# Patient Record
Sex: Female | Born: 1995 | Race: Black or African American | Hispanic: No | Marital: Single | Smoking: Current every day smoker
Health system: Southern US, Community
[De-identification: ages and names within clinical notes are randomized; demographics above are authoritative.]

## PROBLEM LIST (undated history)

## (undated) ENCOUNTER — Inpatient Hospital Stay (HOSPITAL_COMMUNITY): Payer: Self-pay

## (undated) DIAGNOSIS — K219 Gastro-esophageal reflux disease without esophagitis: Secondary | ICD-10-CM

## (undated) DIAGNOSIS — Z789 Other specified health status: Secondary | ICD-10-CM

## (undated) DIAGNOSIS — A6000 Herpesviral infection of urogenital system, unspecified: Secondary | ICD-10-CM

## (undated) DIAGNOSIS — J45909 Unspecified asthma, uncomplicated: Secondary | ICD-10-CM

## (undated) DIAGNOSIS — O24419 Gestational diabetes mellitus in pregnancy, unspecified control: Secondary | ICD-10-CM

## (undated) HISTORY — PX: TONSILLECTOMY: SUR1361

## (undated) HISTORY — DX: Gastro-esophageal reflux disease without esophagitis: K21.9

---

## 2020-06-04 ENCOUNTER — Ambulatory Visit (HOSPITAL_COMMUNITY)
Admission: EM | Admit: 2020-06-04 | Discharge: 2020-06-04 | Disposition: A | Discharge status: Home / Self Care | Payer: Medicaid Other | Attending: Family Medicine | Admitting: Family Medicine

## 2020-06-04 ENCOUNTER — Encounter (HOSPITAL_COMMUNITY): Payer: Self-pay | Admitting: *Deleted

## 2020-06-04 ENCOUNTER — Other Ambulatory Visit: Payer: Self-pay

## 2020-06-04 DIAGNOSIS — M26609 Unspecified temporomandibular joint disorder, unspecified side: Secondary | ICD-10-CM

## 2020-06-04 DIAGNOSIS — G44209 Tension-type headache, unspecified, not intractable: Secondary | ICD-10-CM | POA: Diagnosis not present

## 2020-06-04 MED ORDER — IBUPROFEN 800 MG PO TABS: 800.0000 mg | ORAL_TABLET | Freq: Three times a day (TID) | ORAL | 0 refills | Status: DC

## 2020-06-04 MED ORDER — CYCLOBENZAPRINE HCL 5 MG PO TABS: 5.0000 mg | ORAL_TABLET | Freq: Three times a day (TID) | ORAL | 0 refills | Status: DC | PRN

## 2020-06-04 NOTE — ED Triage Notes (Signed)
Pt reports she grinds her teeth at night and now has dental pain . This has been going on for last 6-7 days. Pt reports all her teeth hurt.

## 2020-06-04 NOTE — Discharge Instructions (Signed)
Get a mouthguard at the pharmacy, take the muscle relaxer and ibuprofen as needed, massage area, use warm compresses as needed. Follow up with a dentist if not better

## 2020-06-04 NOTE — ED Provider Notes (Signed)
[facility]    CSN: [id number] Arrival date & time: 06/04/20  1249      History   Chief Complaint Chief Complaint  Patient presents with   Dental Pain    HPI Joanna Buchanan is a 24 y.o. female.   Patient presenting today with about 6 days of diffuse jaw pain and headaches. Notes sxs worst in the mornings when she first wakes up, and she has caught herself numerous times overnight clenching her jaws lately. Denies sinus pressure or drainage, dizziness, vision changes, fever, chills, mouth lesions, sore throat. Has tried tylenol without relief of her pain. Has not seen a dentist for this issue.      History reviewed. No pertinent past medical history.  There are no problems to display for this patient.   History reviewed. No pertinent surgical history.  OB History   No obstetric history on file.      Home Medications    Prior to Admission medications   Medication Sig Start Date End Date Taking? Authorizing Provider  cyclobenzaprine (FLEXERIL) 5 MG tablet Take 1 tablet (5 mg total) by mouth 3 (three) times daily as needed for muscle spasms. 06/04/20   Particia Nearing, PA-C  ibuprofen (ADVIL) 800 MG tablet Take 1 tablet (800 mg total) by mouth 3 (three) times daily. 06/04/20   Particia Nearing, PA-C    Family History History reviewed. No pertinent family history.  Social History Social History   Tobacco Use   Smoking status: Never Smoker   Smokeless tobacco: Never Used  Building services engineer Use: Some days  Substance Use Topics   Alcohol use: Never   Drug use: Yes    Types: Marijuana     Allergies   Other   Review of Systems Review of Systems PER HPI    Physical Exam Triage Vital Signs ED Triage Vitals  Enc Vitals Group     BP 06/04/20 1426 118/87     Pulse Rate 06/04/20 1426 91     Resp 06/04/20 1426 15     Temp 06/04/20 1426 98.2 F (36.8 C)     Temp Source 06/04/20 1426 Oral     SpO2 06/04/20 1426 100 %      Weight 06/04/20 1430 288 lb (130.6 kg)     Height 06/04/20 1430 5\' 9"  (1.753 m)     Head Circumference --      Peak Flow --      Pain Score 06/04/20 1429 10     Pain Loc --      Pain Edu? --      Excl. in GC? --    No data found.  Updated Vital Signs BP 118/87 (BP Location: Right Arm)    Pulse 91    Temp 98.2 F (36.8 C) (Oral)    Resp 15    Ht 5\' 9"  (1.753 m)    Wt 288 lb (130.6 kg)    LMP 05/14/2020    SpO2 100%    BMI 42.53 kg/m   Visual Acuity Right Eye Distance:   Left Eye Distance:   Bilateral Distance:    Right Eye Near:   Left Eye Near:    Bilateral Near:     Physical Exam Vitals and nursing note reviewed.  Constitutional:      Appearance: Normal appearance. She is not ill-appearing.  HENT:     Head: Atraumatic.     Right Ear: Tympanic membrane normal.     Left Ear:  Tympanic membrane normal.     Nose: Nose normal.     Mouth/Throat:     Mouth: Mucous membranes are moist.     Pharynx: Oropharynx is clear. No posterior oropharyngeal erythema.     Comments: Tongue, oral mucosa, and teeth all benign appearing. Good dentition overall. No gingival erythema or edema Eyes:     Extraocular Movements: Extraocular movements intact.     Conjunctiva/sclera: Conjunctivae normal.  Cardiovascular:     Rate and Rhythm: Normal rate and regular rhythm.     Heart sounds: Normal heart sounds.  Pulmonary:     Effort: Pulmonary effort is normal.     Breath sounds: Normal breath sounds.  Abdominal:     General: Bowel sounds are normal. There is no distension.     Palpations: Abdomen is soft.     Tenderness: There is no abdominal tenderness. There is no guarding.  Musculoskeletal:        General: Tenderness (TMJ ttp, no crepitus or dislocation) present. Normal range of motion.     Cervical back: Normal range of motion and neck supple.  Skin:    General: Skin is warm and dry.  Neurological:     Mental Status: She is alert and oriented to person, place, and time.  Psychiatric:         Mood and Affect: Mood normal.        Thought Content: Thought content normal.        Judgment: Judgment normal.      UC Treatments / Results  Labs (all labs ordered are listed, but only abnormal results are displayed) Labs Reviewed - No data to display  EKG   Radiology No results found.  Procedures Procedures (including critical care time)  Medications Ordered in UC Medications - No data to display  Initial Impression / Assessment and Plan / UC Course  I have reviewed the triage vital signs and the nursing notes.  Pertinent labs & imaging results that were available during my care of the patient were reviewed by me and considered in my medical decision making (see chart for details).     Suspect TMJ disorder causing her pain sxs. Discussed mouthguard, flexeril, ibuprofen, massage. Eat soft foods for the next week or so, avoid gum chewing. F/u with dentist if sxs not resolving.    Final Clinical Impressions(s) / UC Diagnoses   Final diagnoses:  TMJ (temporomandibular joint disorder)  Tension headache     Discharge Instructions     Get a mouthguard at the pharmacy, take the muscle relaxer and ibuprofen as needed, massage area, use warm compresses as needed. Follow up with a dentist if not better    ED Prescriptions    Medication Sig Dispense Auth. Provider   ibuprofen (ADVIL) 800 MG tablet Take 1 tablet (800 mg total) by mouth 3 (three) times daily. 21 tablet Particia Nearing, [place]   cyclobenzaprine (FLEXERIL) 5 MG tablet Take 1 tablet (5 mg total) by mouth 3 (three) times daily as needed for muscle spasms. 30 tablet Particia Nearing, [place]     PDMP not reviewed this encounter.   Particia Nearing, [place] 06/04/20 1528

## 2020-07-11 ENCOUNTER — Encounter (HOSPITAL_COMMUNITY): Payer: Self-pay | Admitting: Emergency Medicine

## 2020-07-11 ENCOUNTER — Emergency Department (HOSPITAL_COMMUNITY)
Admission: EM | Admit: 2020-07-11 | Discharge: 2020-07-11 | Disposition: A | Discharge status: Home / Self Care | Payer: Medicaid Other | Attending: Emergency Medicine | Admitting: Emergency Medicine

## 2020-07-11 DIAGNOSIS — R1031 Right lower quadrant pain: Secondary | ICD-10-CM | POA: Diagnosis not present

## 2020-07-11 DIAGNOSIS — R112 Nausea with vomiting, unspecified: Secondary | ICD-10-CM | POA: Diagnosis not present

## 2020-07-11 LAB — URINALYSIS, ROUTINE W REFLEX MICROSCOPIC
Bacteria, UA: NONE SEEN
Bilirubin Urine: NEGATIVE
Glucose, UA: NEGATIVE mg/dL
Hgb urine dipstick: NEGATIVE
Ketones, ur: NEGATIVE mg/dL
Nitrite: NEGATIVE
Protein, ur: NEGATIVE mg/dL
Specific Gravity, Urine: 1.02 (ref 1.005–1.030)
pH: 6 (ref 5.0–8.0)

## 2020-07-11 LAB — I-STAT BETA HCG BLOOD, ED (MC, WL, AP ONLY): I-stat hCG, quantitative: <5 m[IU]/mL (ref <5)

## 2020-07-11 LAB — COMPREHENSIVE METABOLIC PANEL
ALT: 17 U/L (ref 0–44)
AST: 21 U/L (ref 15–41)
Albumin: 3.8 g/dL (ref 3.5–5.0)
Alkaline Phosphatase: 51 U/L (ref 38–126)
Anion gap: 9 (ref 5–15)
BUN: 12 mg/dL (ref 6–20)
CO2: 22 mmol/L (ref 22–32)
Calcium: 8.9 mg/dL (ref 8.9–10.3)
Chloride: 107 mmol/L (ref 98–111)
Creatinine, Ser: 0.84 mg/dL (ref 0.44–1.00)
GFR, Estimated: >60 mL/min (ref >60)
Glucose, Bld: 111 mg/dL — ABNORMAL HIGH (ref 70–99)
Potassium: 4.1 mmol/L (ref 3.5–5.1)
Sodium: 138 mmol/L (ref 135–145)
Total Bilirubin: 0.2 mg/dL — ABNORMAL LOW (ref 0.3–1.2)
Total Protein: 7 g/dL (ref 6.5–8.1)

## 2020-07-11 LAB — CBC
HCT: 40.4 % (ref 36.0–46.0)
Hemoglobin: 12 g/dL (ref 12.0–15.0)
MCH: 26 pg (ref 26.0–34.0)
MCHC: 29.7 g/dL — ABNORMAL LOW (ref 30.0–36.0)
MCV: 87.6 fL (ref 80.0–100.0)
Platelets: 362 10*3/uL (ref 150–400)
RBC: 4.61 MIL/uL (ref 3.87–5.11)
RDW: 15 % (ref 11.5–15.5)
WBC: 7.5 10*3/uL (ref 4.0–10.5)
nRBC: 0 % (ref 0.0–0.2)

## 2020-07-11 LAB — LIPASE, BLOOD: Lipase: 34 U/L (ref 11–51)

## 2020-07-11 MED ORDER — DICYCLOMINE HCL 10 MG/ML IM SOLN
20.0000 mg | Freq: Once | INTRAMUSCULAR | Status: AC
  Administered 2020-07-11: 20 mg via INTRAMUSCULAR
  Filled 2020-07-11: qty 2

## 2020-07-11 MED ORDER — ONDANSETRON 4 MG PO TBDP
4.0000 mg | ORAL_TABLET | Freq: Once | ORAL | Status: AC
  Administered 2020-07-11: 4 mg via ORAL
  Filled 2020-07-11: qty 1

## 2020-07-11 MED ORDER — ONDANSETRON 4 MG PO TBDP: 4.0000 mg | ORAL_TABLET | Freq: Three times a day (TID) | ORAL | 0 refills | Status: DC | PRN

## 2020-07-11 NOTE — ED Provider Notes (Signed)
[facility] EMERGENCY DEPARTMENT Provider Note   CSN: [id number] Arrival date & time: 07/11/20  0846     History Chief Complaint  Patient presents with  . Abdominal Cramping    Joanna Buchanan is a 24 y.o. female with history of cesarean section presents to the ED for evaluation of nausea and vomiting, intermittent for the last 1 week.  States this past week she wakes up vomiting and then improves throughout the day and "forgets about it".  This morning felt worse so she came to the ED for evaluation.  Reports abdominal sharp crampy type pain that comes and goes right before she throws up that is located in the right mid abdomen.  This improves after she is not throwing up.  Pain currently is mild. Denies any fevers.  Denies any hematemesis.  Denies any changes in her bowel movements.  Has noticed more urination but no dysuria, hematuria, urgency.  Denies associated abnormal vaginal discharge, vaginal bleeding.  She is not on birth control.  States she may have eaten something last week that could have made her sick.  No sick contacts.  No fever.  No upper respiratory symptoms.  Patient drinks alcohol socially, every other weekend but none recently.  She smokes marijuana almost every day.  No interventions.   HPI     History reviewed. No pertinent past medical history.  There are no problems to display for this patient.   History reviewed. No pertinent surgical history.   OB History   No obstetric history on file.     No family history on file.  Social History   Tobacco Use  . Smoking status: Never Smoker  . Smokeless tobacco: Never Used  Vaping Use  . Vaping Use: Some days  Substance Use Topics  . Alcohol use: Never  . Drug use: Yes    Types: Marijuana    Home Medications Prior to Admission medications   Medication Sig Start Date End Date Taking? Authorizing Provider  cyclobenzaprine (FLEXERIL) 5 MG tablet Take 1 tablet (5 mg total) by mouth 3  (three) times daily as needed for muscle spasms. 06/04/20   Particia Nearing, PA-C  ibuprofen (ADVIL) 800 MG tablet Take 1 tablet (800 mg total) by mouth 3 (three) times daily. 06/04/20   Particia Nearing, PA-C  ondansetron (ZOFRAN ODT) 4 MG disintegrating tablet Take 1 tablet (4 mg total) by mouth every 8 (eight) hours as needed for nausea or vomiting. 07/11/20   Liberty Handy, PA-C    Allergies    Other  Review of Systems   Review of Systems  Gastrointestinal: Positive for abdominal pain, nausea and vomiting.  All other systems reviewed and are negative.   Physical Exam Updated Vital Signs BP 133/89 (BP Location: Left Arm)   Pulse 95   Temp 98.3 F (36.8 C) (Oral)   Resp 18   Ht 5\' 9"  (1.753 m)   Wt 127 kg   LMP 06/06/2020   SpO2 100%   BMI 41.35 kg/m   Physical Exam Vitals and nursing note reviewed.  Constitutional:      Appearance: She is well-developed.     Comments: Non toxic in NAD  HENT:     Head: Normocephalic and atraumatic.     Nose: Nose normal.  Eyes:     Conjunctiva/sclera: Conjunctivae normal.  Cardiovascular:     Rate and Rhythm: Normal rate and regular rhythm.  Pulmonary:     Effort: Pulmonary effort is normal.  Breath sounds: Normal breath sounds.  Abdominal:     General: Bowel sounds are normal.     Palpations: Abdomen is soft.     Tenderness: There is abdominal tenderness (mild).     Comments: Right mid abdominal tenderness.  Soft. No G/R/R. No suprapubic or CVA tenderness. Negative Murphy's and McBurney's. Active BS to lower quadrants.  Cesarean section surgical scar noted, well healed.   Musculoskeletal:        General: Normal range of motion.     Cervical back: Normal range of motion.  Skin:    General: Skin is warm and dry.     Capillary Refill: Capillary refill takes less than 2 seconds.  Neurological:     Mental Status: She is alert.  Psychiatric:        Behavior: Behavior normal.     ED Results / Procedures /  Treatments   Labs (all labs ordered are listed, but only abnormal results are displayed) Labs Reviewed  COMPREHENSIVE METABOLIC PANEL - Abnormal; Notable for the following components:      Result Value   Glucose, Bld 111 (*)    Total Bilirubin 0.2 (*)    All other components within normal limits  CBC - Abnormal; Notable for the following components:   MCHC 29.7 (*)    All other components within normal limits  URINALYSIS, ROUTINE W REFLEX MICROSCOPIC - Abnormal; Notable for the following components:   Leukocytes,Ua MODERATE (*)    All other components within normal limits  LIPASE, BLOOD  I-STAT BETA HCG BLOOD, ED ([facility], [facility], [facility] ONLY)    EKG None  Radiology No results found.  Procedures Procedures (including critical care time)  Medications Ordered in ED Medications  ondansetron (ZOFRAN-ODT) disintegrating tablet 4 mg (4 mg Oral Given 07/11/20 1010)  dicyclomine (BENTYL) injection 20 mg (20 mg Intramuscular Given 07/11/20 1010)    ED Course  I have reviewed the triage vital signs and the nursing notes.  Pertinent labs & imaging results that were available during my care of the patient were reviewed by me and considered in my medical decision making (see chart for details).  Clinical Course as of Jul 11 1040  [facility] Jul 11, 2020  0945 Glori Luis): MODERATE [CG]  0946 Bacteria, UA: NONE SEEN [CG]  0946 I-stat hCG, quantitative: <5.0 [CG]  0946 WBC: 7.5 [CG]  1038 Reevaluated patient.  Reports improvement in symptoms.  No longer vomiting.  Tolerating fluids.  Repeat abdominal exam without abdominal tenderness.   [CG]    Clinical Course User Index [CG] Liberty Handy, PA-C   MDM Rules/Calculators/A&P                          24 year old female here with nausea, vomiting and intermittent right mid abdominal cramping.  Thinks she may have eaten something that made her sick last week.  Smokes marijuana daily.  EMR, triage nurse notes reviewed to tumor history  ER  work-up including lab work, urinalysis, hCG initiated by triage RN.  I have personally visualized and interpreted lab work/ER work up today and this is reassuring.  No leukocytosis.  Normal electrolytes.  Normal LFTs, lipase, creatinine.  hCG is negative.  Urinalysis without convincing signs of infection.  I ordered ODT Zofran, Bentyl IM.  1039: Patient reevaluated after medicines and feels better.  Tolerating p.o.  Repeat abdominal exam benign.  DDx includes viral illness, cannabinoid hyperemesis syndrome versus other.  Considered acute cholecystitis, appendicitis, diverticulitis but  these are unlikely given her benign exam, resolution of symptoms, lab work.  No hematuria, history of kidney stones and this is unlikely.  Patient seen last year for abdominal pain and had a CT scan at that time that was benign did not show any gallstones, kidney stones.  We will discharge patient with symptomatic management, oral hydration, gentle diet.  Return precautions discussed with patient.  She is in agreement with plan of care.   Final Clinical Impression(s) / ED Diagnoses Final diagnoses:  Nausea and vomiting in adult    Rx / DC Orders ED Discharge Orders         Ordered    ondansetron (ZOFRAN ODT) 4 MG disintegrating tablet  Every 8 hours PRN        07/11/20 1041           Liberty Handy, [place] 07/11/20 1041    Wynetta Fines, MD 07/15/20 [phone]

## 2020-07-11 NOTE — Discharge Instructions (Addendum)
You were seen in the ED for nausea, vomiting and abdominal pain  Lab work, urinalysis were all normal  I think your symptoms are from a virus.  We discussed other causes like marijuana, food poisoning  Please take ondansetron every 8 hours for nausea.  Stay hydrated.  Gentle diet until you start to feel better.  Avoid things that will irritate her stomach like greasy, fatty meals, ibuprofen or aspirin products, alcohol, marijuana  Return to the ED for worsening or severe constant abdominal pain, continued nausea and vomiting despite medicines, fevers, urinary symptoms or any other concerns

## 2020-07-11 NOTE — ED Triage Notes (Signed)
Pt arrives to ED with chief complaint of sharp abdominal cramping worse in mid right side with n/v started 3 days ago worse this morning.

## 2020-11-26 ENCOUNTER — Other Ambulatory Visit: Payer: Self-pay

## 2020-11-26 ENCOUNTER — Emergency Department (HOSPITAL_COMMUNITY): Payer: Medicaid Other

## 2020-11-26 ENCOUNTER — Encounter (HOSPITAL_COMMUNITY): Payer: Self-pay

## 2020-11-26 ENCOUNTER — Emergency Department (HOSPITAL_COMMUNITY)
Admission: EM | Admit: 2020-11-26 | Discharge: 2020-11-26 | Disposition: A | Discharge status: Home / Self Care | Payer: Medicaid Other | Attending: Emergency Medicine | Admitting: Emergency Medicine

## 2020-11-26 DIAGNOSIS — R110 Nausea: Secondary | ICD-10-CM | POA: Insufficient documentation

## 2020-11-26 DIAGNOSIS — B9689 Other specified bacterial agents as the cause of diseases classified elsewhere: Secondary | ICD-10-CM | POA: Diagnosis not present

## 2020-11-26 DIAGNOSIS — R030 Elevated blood-pressure reading, without diagnosis of hypertension: Secondary | ICD-10-CM | POA: Diagnosis not present

## 2020-11-26 DIAGNOSIS — K8050 Calculus of bile duct without cholangitis or cholecystitis without obstruction: Secondary | ICD-10-CM

## 2020-11-26 DIAGNOSIS — R1011 Right upper quadrant pain: Secondary | ICD-10-CM | POA: Diagnosis present

## 2020-11-26 DIAGNOSIS — N939 Abnormal uterine and vaginal bleeding, unspecified: Secondary | ICD-10-CM | POA: Diagnosis not present

## 2020-11-26 DIAGNOSIS — D649 Anemia, unspecified: Secondary | ICD-10-CM | POA: Insufficient documentation

## 2020-11-26 DIAGNOSIS — D72829 Elevated white blood cell count, unspecified: Secondary | ICD-10-CM | POA: Diagnosis not present

## 2020-11-26 LAB — COMPREHENSIVE METABOLIC PANEL
ALT: 16 U/L (ref 0–44)
AST: 15 U/L (ref 15–41)
Albumin: 3.7 g/dL (ref 3.5–5.0)
Alkaline Phosphatase: 53 U/L (ref 38–126)
Anion gap: 6 (ref 5–15)
BUN: 11 mg/dL (ref 6–20)
CO2: 21 mmol/L — ABNORMAL LOW (ref 22–32)
Calcium: 9.2 mg/dL (ref 8.9–10.3)
Chloride: 108 mmol/L (ref 98–111)
Creatinine, Ser: 0.79 mg/dL (ref 0.44–1.00)
GFR, Estimated: >60 mL/min (ref >60)
Glucose, Bld: 100 mg/dL — ABNORMAL HIGH (ref 70–99)
Potassium: 3.8 mmol/L (ref 3.5–5.1)
Sodium: 135 mmol/L (ref 135–145)
Total Bilirubin: 0.3 mg/dL (ref 0.3–1.2)
Total Protein: 7.1 g/dL (ref 6.5–8.1)

## 2020-11-26 LAB — CBC
HCT: 37.2 % (ref 36.0–46.0)
Hemoglobin: 11.5 g/dL — ABNORMAL LOW (ref 12.0–15.0)
MCH: 27.4 pg (ref 26.0–34.0)
MCHC: 30.9 g/dL (ref 30.0–36.0)
MCV: 88.8 fL (ref 80.0–100.0)
Platelets: 335 10*3/uL (ref 150–400)
RBC: 4.19 MIL/uL (ref 3.87–5.11)
RDW: 15.8 % — ABNORMAL HIGH (ref 11.5–15.5)
WBC: 6.6 10*3/uL (ref 4.0–10.5)
nRBC: 0 % (ref 0.0–0.2)

## 2020-11-26 LAB — WET PREP, GENITAL
Sperm: NONE SEEN
Trich, Wet Prep: NONE SEEN
Yeast Wet Prep HPF POC: NONE SEEN

## 2020-11-26 LAB — URINALYSIS, ROUTINE W REFLEX MICROSCOPIC
Bilirubin Urine: NEGATIVE
Glucose, UA: NEGATIVE mg/dL
Ketones, ur: NEGATIVE mg/dL
Nitrite: NEGATIVE
Protein, ur: NEGATIVE mg/dL
Specific Gravity, Urine: 1.025 (ref 1.005–1.030)
pH: 6 (ref 5.0–8.0)

## 2020-11-26 LAB — LIPASE, BLOOD: Lipase: 28 U/L (ref 11–51)

## 2020-11-26 LAB — I-STAT BETA HCG BLOOD, ED (MC, WL, AP ONLY): I-stat hCG, quantitative: <5 m[IU]/mL (ref <5)

## 2020-11-26 MED ORDER — METRONIDAZOLE 500 MG PO TABS
500.0000 mg | ORAL_TABLET | Freq: Once | ORAL | Status: AC
  Administered 2020-11-26: 500 mg via ORAL
  Filled 2020-11-26: qty 1

## 2020-11-26 MED ORDER — METRONIDAZOLE 500 MG PO TABS: 500.0000 mg | ORAL_TABLET | Freq: Two times a day (BID) | ORAL | 0 refills | Status: DC

## 2020-11-26 MED ORDER — ONDANSETRON 4 MG PO TBDP
4.0000 mg | ORAL_TABLET | Freq: Once | ORAL | Status: AC
  Administered 2020-11-26: 4 mg via ORAL
  Filled 2020-11-26: qty 1

## 2020-11-26 MED ORDER — OXYCODONE-ACETAMINOPHEN 5-325 MG PO TABS
1.0000 | ORAL_TABLET | Freq: Once | ORAL | Status: AC
  Administered 2020-11-26: 1 via ORAL
  Filled 2020-11-26: qty 1

## 2020-11-26 NOTE — ED Triage Notes (Signed)
Patient complains of a few months of ongoing abdominal pain. Reports some diarrhea with same. No nausea, no vomiting, no dysuria

## 2020-11-26 NOTE — Progress Notes (Signed)
Per Delorise Jackson, RN IV not needed

## 2020-11-26 NOTE — ED Provider Notes (Signed)
[facility]   CSN: [id number] Arrival date & time: 11/26/20  1223     History No chief complaint on file.   Joanna Buchanan is a 25 y.o. female who presents for concern of several months of gradually worsening abdominal pain, worse in the right upper quadrant.  Initially her pain was intermittent, but at this time it waxes and wanes but is constant low dull ache.  Pain is worse after eating, with associated episodes of nausea without vomiting.  Additionally patient endorses greater than 10 bowel movements daily, and small amounts, varying from normally formed stool to watery in nature.  Denies melena or hematochezia.  Recently she completed her LMP approximately 1 week ago but has had persistent intermittent vaginal spotting since that time.  She is sexually active with one female partner in a monogamous relationship, she does not use protection with condoms and she is not on any contraception.  She presents to the emergency department with her toddler daughter.  She denies any fevers or chills at home but does endorse new vaginal discharge which she describes as mucus.  Denies any vaginal pain.  Patient endorses diet primarily consisting of processed foods, large amount of fatty and greasy foods, and very little fruits or vegetables.  She describes her pain as sharp intermittently but constant dull achy pain worse in the right side of her abdomen in the periumbilical area.  Pain not improved with Tylenol or ibuprofen at home.  I personally reviewed this patient's medical records.  She has history of C-section, but carries no medical diagnoses and is not on any medications every day.  HPI     History reviewed. No pertinent past medical history.  There are no problems to display for this patient.   History reviewed. No pertinent surgical history.   OB History   No obstetric history on file.     No family history on  file.  Social History   Tobacco Use  . Smoking status: Never Smoker  . Smokeless tobacco: Never Used  Vaping Use  . Vaping Use: Some days  Substance Use Topics  . Alcohol use: Never  . Drug use: Yes    Types: Marijuana    Home Medications Prior to Admission medications   Medication Sig Start Date End Date Taking? Authorizing Provider  metroNIDAZOLE (FLAGYL) 500 MG tablet Take 1 tablet (500 mg total) by mouth 2 (two) times daily. 11/26/20  Yes Chadwin Fury, Lupe Carneyebekah R, PA-C  cyclobenzaprine (FLEXERIL) 5 MG tablet Take 1 tablet (5 mg total) by mouth 3 (three) times daily as needed for muscle spasms. Patient not taking: No sig reported 06/04/20   Particia NearingLane, Rachel Elizabeth, PA-C  ibuprofen (ADVIL) 800 MG tablet Take 1 tablet (800 mg total) by mouth 3 (three) times daily. Patient not taking: No sig reported 06/04/20   Particia NearingLane, Rachel Elizabeth, PA-C  ondansetron (ZOFRAN ODT) 4 MG disintegrating tablet Take 1 tablet (4 mg total) by mouth every 8 (eight) hours as needed for nausea or vomiting. Patient not taking: No sig reported 07/11/20   Liberty HandyGibbons, Claudia J, PA-C    Allergies    Other, Shellfish allergy, and Shrimp extract allergy skin test  Review of Systems   Review of Systems  Constitutional: Negative for activity change, appetite change, chills, diaphoresis, fatigue and fever.  HENT: Negative.   Respiratory: Negative.   Cardiovascular: Negative.   Gastrointestinal: Positive for abdominal pain, diarrhea and nausea. Negative for blood in stool, constipation and vomiting.  Genitourinary: Positive for frequency, menstrual problem, vaginal bleeding and vaginal discharge. Negative for decreased urine volume, difficulty urinating, dysuria, hematuria, pelvic pain, urgency and vaginal pain.  Musculoskeletal: Negative.   Skin: Negative.   Neurological: Negative.     Physical Exam Updated Vital Signs BP (!) 142/68   Pulse 60   Temp 98.4 F (36.9 C) (Oral)   Resp 20   SpO2 100%   Physical  Exam Vitals and nursing note reviewed. Exam conducted with a chaperone present.  Constitutional:      Appearance: She is obese.  HENT:     Head: Normocephalic and atraumatic.     Nose: Nose normal.     Mouth/Throat:     Mouth: Mucous membranes are moist.     Pharynx: Oropharynx is clear. Uvula midline. No oropharyngeal exudate or posterior oropharyngeal erythema.     Tonsils: No tonsillar exudate or tonsillar abscesses.  Eyes:     General: Lids are normal. Vision grossly intact.        Right eye: No discharge.        Left eye: No discharge.     Extraocular Movements: Extraocular movements intact.     Conjunctiva/sclera: Conjunctivae normal.     Pupils: Pupils are equal, round, and reactive to light.  Neck:     Trachea: Trachea and phonation normal.  Cardiovascular:     Rate and Rhythm: Normal rate and regular rhythm.     Pulses: Normal pulses.     Heart sounds: Normal heart sounds. No murmur heard.   Pulmonary:     Effort: Pulmonary effort is normal. No respiratory distress.     Breath sounds: Normal breath sounds. No wheezing or rales.  Chest:     Chest wall: No mass, lacerations, deformity, tenderness or crepitus.  Abdominal:     General: Bowel sounds are normal. There is no distension.     Palpations: Abdomen is soft.     Tenderness: There is abdominal tenderness in the right upper quadrant, epigastric area, periumbilical area and suprapubic area. There is no right CVA tenderness, left CVA tenderness, guarding or rebound. Positive signs include Murphy's sign. Negative signs include Rovsing's sign and McBurney's sign.  Genitourinary:    Exam position: Lithotomy position.     Vagina: Vaginal discharge and bleeding present.     Cervix: Normal.     Uterus: Normal.      Adnexa: Left adnexa normal.       Right: Tenderness present. No fullness.         Left: No tenderness or fullness.       Comments: Thick clear to yellow discharge in the vaginal vault with scant  bleeding Musculoskeletal:        General: No deformity.     Cervical back: Normal range of motion and neck supple. No tenderness or crepitus. No pain with movement, spinous process tenderness or muscular tenderness.     Right lower leg: No edema.     Left lower leg: No edema.  Lymphadenopathy:     Cervical: No cervical adenopathy.  Skin:    General: Skin is warm and dry.     Capillary Refill: Capillary refill takes less than 2 seconds.  Neurological:     Mental Status: She is alert and oriented to person, place, and time. Mental status is at baseline.     Sensory: Sensation is intact.     Motor: Motor function is intact.     Gait: Gait is intact.  Psychiatric:  Mood and Affect: Mood normal. Affect is tearful.     ED Results / Procedures / Treatments   Labs (all labs ordered are listed, but only abnormal results are displayed) Labs Reviewed  WET PREP, GENITAL - Abnormal; Notable for the following components:      Result Value   Clue Cells Wet Prep HPF POC PRESENT (*)    WBC, Wet Prep HPF POC MANY (*)    All other components within normal limits  COMPREHENSIVE METABOLIC PANEL - Abnormal; Notable for the following components:   CO2 21 (*)    Glucose, Bld 100 (*)    All other components within normal limits  CBC - Abnormal; Notable for the following components:   Hemoglobin 11.5 (*)    RDW 15.8 (*)    All other components within normal limits  URINALYSIS, ROUTINE W REFLEX MICROSCOPIC - Abnormal; Notable for the following components:   APPearance HAZY (*)    Hgb urine dipstick LARGE (*)    Leukocytes,Ua LARGE (*)    Bacteria, UA FEW (*)    All other components within normal limits  LIPASE, BLOOD  I-STAT BETA HCG BLOOD, ED ([facility], [facility], [facility] ONLY)  GC/CHLAMYDIA PROBE AMP ([place]) NOT AT [facility]    EKG None  Radiology CT Abdomen Pelvis Wo Contrast  Result Date: 11/26/2020 CLINICAL DATA:  Right lower quadrant pain EXAM: CT ABDOMEN AND PELVIS WITHOUT CONTRAST TECHNIQUE:  Multidetector CT imaging of the abdomen and pelvis was performed following the standard protocol without IV contrast. COMPARISON:  12/01/2018 FINDINGS: Lower chest: Lung bases are clear. No effusions. Heart is normal size. Small hiatal hernia. Hepatobiliary: No focal hepatic abnormality. Gallbladder unremarkable. Pancreas: No focal abnormality or ductal dilatation. Spleen: No focal abnormality.  Normal size. Adrenals/Urinary Tract: No adrenal abnormality. No focal renal abnormality. No stones or hydronephrosis. Urinary bladder is unremarkable. Stomach/Bowel: Normal appendix. Stomach, large and small bowel grossly unremarkable. Vascular/Lymphatic: No evidence of aneurysm or adenopathy. Reproductive: Uterus and adnexa unremarkable.  No mass. Other: No free fluid or free air. Musculoskeletal: No acute bony abnormality. IMPRESSION: No acute findings in the abdomen or pelvis. Electronically Signed   By: Charlett Nose M.D.   On: 11/26/2020 20:43   US Abdomen Limited RUQ (LIVER/GB)  Result Date: 11/26/2020 CLINICAL DATA:  Right upper quadrant pain for 1 year EXAM: ULTRASOUND ABDOMEN LIMITED RIGHT UPPER QUADRANT COMPARISON:  12/01/2018 FINDINGS: Gallbladder: No gallstones or wall thickening visualized. No sonographic Murphy sign noted by sonographer. Common bile duct: Diameter: 2 mm Liver: No focal lesion identified. Within normal limits in parenchymal echogenicity. Portal vein is patent on color Doppler imaging with normal direction of blood flow towards the liver. Other: None. IMPRESSION: 1. Unremarkable right upper quadrant ultrasound. Electronically Signed   By: Sharlet Salina M.D.   On: 11/26/2020 16:43    Procedures Procedures   Medications Ordered in ED Medications  ondansetron (ZOFRAN-ODT) disintegrating tablet 4 mg (4 mg Oral Given 11/26/20 1558)  oxyCODONE-acetaminophen (PERCOCET/ROXICET) 5-325 MG per tablet 1 tablet (1 tablet Oral Given 11/26/20 1558)  metroNIDAZOLE (FLAGYL) tablet 500 mg (500 mg Oral  Given 11/26/20 2142)    ED Course  I have reviewed the triage vital signs and the nursing notes.  Pertinent labs & imaging results that were available during my care of the patient were reviewed by me and considered in my medical decision making (see chart for details).  Clinical Course as of 11/27/20 1714  Wed Nov 26, 2020  1937 Significant delay in patient's abdominal  CT, due to complications obtaining IV access. IV team has been consulted.  [RS]    Clinical Course User Index [RS] Iyana Topor, Eugene Gavia, PA-C   MDM Rules/Calculators/A&P                         25 year old female presents with concern for right-sided abdominal pain worsening over the last several months, worse after eating with associated nausea and loose stools.  The differential diagnosis for RUQ includes but is not limited to:  Cholelithiasis / choledocholithiasis / cholecystitis / cholangitis, hepatitis (eg. viral, alcoholic, toxic),liver abscess, pancreatitis, liver / pancreatic / biliary tract cancer, ischemic hepatopathy (shock liver), hepatic vein obstruction (Budd-Chiari syndrome), liver cell adenoma, peptic ulcer disease (duodenal), functional or nonulcer dyspepsia, right lower lobe pneumonia, pyelonephritis, urinary calculi,  Fitz-Hugh-Curtis syndrome (with pelvic inflammatory disease), herpes zoster, trauma or musculoskeletal pain, herniated disk, abdominal abscess, intestinal ischemia, physical or sexual abuse, ectopic pregnancy, IUP, Mittelschmerz, ovarian cyst/torsion, threatened/ievitable abortion, PID, endometriosis, molar pregnancy, heterotopic pregnancy, corpus luteum cyst, appendicitis, UTI/renal colic, IBD.   Mild hypertensive on intake, vital signs otherwise normal.  Cardiopulmonary exam is normal, abdominal exam revealed positive Murphy sign, with associated epigastric and periumbilical tenderness to palpation without palpable mass or pulsatile mass.  There is no McBurney point tenderness, Rovsing sign.  No  crepitus, no guarding or rebound.  GU exam will be performed once patient is no longer in the hallway.  Basic laboratory studies obtained in triage, CBC with anemia, hemoglobin 11.5, baseline 12.  CMP unremarkable, lipase normal.  Patient is not pregnant, UA revealed large amount of hemoglobin, large amount of leukocytes and few bacteria.  We will proceed with right upper quadrant ultrasound, as well as wet prep and GC/chlamydia testing. Analgesia ordered.   GU exam performed with copious clear to white vaginal discharge, without adnexal fullness or cervical motion tenderness.  Wet mount and GC/chlamydia pending.  Right upper quadrant ultrasound unremarkable, CT abdomen pelvis delayed significantly due to difficulty obtaining IV access.  Ultimately skin was changed to noncontrast image.  It was negative for any acute abnormality in the abdomen or pelvis.  Wet mount positive for clue cells.  Will treat with Flagyl as patient is symptomatic.  First dose administered in the ED.  Given reassuring vital signs, laboratory, and imaging studies, no further was warranted in ED at this time.  Despite negative imaging, suspect patient's symptoms are secondary to biliary colic.  Will provide information regarding dietary lifestyle modifications, patient follow-up with gastroenterology, and will prescribe outpatient course of metronidazole for bacterial vaginosis.  There is not appear to be any emergent issue contributing to patient's symptoms based on her work-up in the ED today.  Zamirah voiced understanding of her medical evaluation and treatment plan.  Each of her questions was answered to her expressed satisfaction.  Return precautions given.  Patient is stable and appropriate for discharge at this time.  This chart was dictated using voice recognition software, Dragon. Despite the best efforts of this provider to proofread and correct errors, errors may still occur which can change documentation  meaning.  Final Clinical Impression(s) / ED Diagnoses Final diagnoses:  RUQ pain  Biliary colic    Rx / DC Orders ED Discharge Orders         Ordered    metroNIDAZOLE (FLAGYL) 500 MG tablet  2 times daily        11/26/20 2100           Laquanna Veazey,  Eugene Gavia, PA-C 11/27/20 1718    Horton, Clabe Seal, DO 11/28/20 0012

## 2020-11-26 NOTE — Discharge Instructions (Signed)
You were seen in the ER for your right-sided abdominal pain.  Your physical exam, vital signs, blood work, imaging studies are very reassuring.  While the exact cause of your symptoms remains unclear, there is not appear to be any emergent problem.  I suspect her symptoms are contributed to by what is called biliary colic, which is an issue related to your gallbladder.  Below is information for appropriate dietary changes you may make to help with your symptoms.  Additionally below is the contact information for gastroenterologist, whom you may follow-up with if your symptoms persist.  Additionally you were found to have a bacterial vaginosis infection.  This is a bacterial infection in your vagina that is non-STD.  You were administered your first dose of antibiotics in the emergency department and have been prescribed antibiotic to take at home.  Return to the ER if you develop any chest pain, shortness breath, palpitations, nausea vomiting does not stop or any other new severe symptoms.

## 2020-11-27 LAB — GC/CHLAMYDIA PROBE AMP (~~LOC~~) NOT AT ARMC
Chlamydia: NEGATIVE
Comment: NEGATIVE
Comment: NORMAL
Neisseria Gonorrhea: NEGATIVE

## 2021-10-12 ENCOUNTER — Inpatient Hospital Stay (HOSPITAL_COMMUNITY): Payer: Medicaid Other

## 2021-10-12 ENCOUNTER — Inpatient Hospital Stay (HOSPITAL_COMMUNITY)
Admission: AD | Admit: 2021-10-12 | Discharge: 2021-10-12 | Disposition: A | Discharge status: Home / Self Care | Payer: Medicaid Other | Attending: Obstetrics & Gynecology | Admitting: Obstetrics & Gynecology

## 2021-10-12 ENCOUNTER — Encounter (HOSPITAL_COMMUNITY): Payer: Self-pay

## 2021-10-12 DIAGNOSIS — O26899 Other specified pregnancy related conditions, unspecified trimester: Secondary | ICD-10-CM | POA: Diagnosis not present

## 2021-10-12 DIAGNOSIS — O26891 Other specified pregnancy related conditions, first trimester: Secondary | ICD-10-CM | POA: Diagnosis present

## 2021-10-12 DIAGNOSIS — R102 Pelvic and perineal pain: Secondary | ICD-10-CM | POA: Insufficient documentation

## 2021-10-12 DIAGNOSIS — R109 Unspecified abdominal pain: Secondary | ICD-10-CM | POA: Diagnosis not present

## 2021-10-12 DIAGNOSIS — O219 Vomiting of pregnancy, unspecified: Secondary | ICD-10-CM | POA: Insufficient documentation

## 2021-10-12 DIAGNOSIS — Z3A01 Less than 8 weeks gestation of pregnancy: Secondary | ICD-10-CM

## 2021-10-12 HISTORY — DX: Herpesviral infection of urogenital system, unspecified: A60.00

## 2021-10-12 HISTORY — DX: Other specified health status: Z78.9

## 2021-10-12 LAB — URINALYSIS, ROUTINE W REFLEX MICROSCOPIC
Bilirubin Urine: NEGATIVE
Glucose, UA: NEGATIVE mg/dL
Hgb urine dipstick: NEGATIVE
Ketones, ur: NEGATIVE mg/dL
Nitrite: NEGATIVE
Protein, ur: NEGATIVE mg/dL
Specific Gravity, Urine: 1.029 (ref 1.005–1.030)
pH: 6 (ref 5.0–8.0)

## 2021-10-12 LAB — ABO/RH: ABO/RH(D): B POS

## 2021-10-12 LAB — CBC
HCT: 35.2 % — ABNORMAL LOW (ref 36.0–46.0)
Hemoglobin: 11.5 g/dL — ABNORMAL LOW (ref 12.0–15.0)
MCH: 28.8 pg (ref 26.0–34.0)
MCHC: 32.7 g/dL (ref 30.0–36.0)
MCV: 88 fL (ref 80.0–100.0)
Platelets: 330 10*3/uL (ref 150–400)
RBC: 4 MIL/uL (ref 3.87–5.11)
RDW: 14.5 % (ref 11.5–15.5)
WBC: 8.2 10*3/uL (ref 4.0–10.5)
nRBC: 0 % (ref 0.0–0.2)

## 2021-10-12 LAB — POC URINE PREG, ED: Preg Test, Ur: POSITIVE — AB

## 2021-10-12 LAB — WET PREP, GENITAL
Clue Cells Wet Prep HPF POC: NONE SEEN
Sperm: NONE SEEN
Trich, Wet Prep: NONE SEEN
WBC, Wet Prep HPF POC: <10 (ref <10)
Yeast Wet Prep HPF POC: NONE SEEN

## 2021-10-12 NOTE — MAU Note (Signed)
PT SAYS SHE WENT TO [facility] AT 930PM-FOR SHARP PAINS LOWER ABD - THAT STARTED Friday  WORSE NOW -  HAD SHARP PAIN IN CHEST ON WAY TO ER- DID NOT TELL STAFF- NONE NOW NO VB

## 2021-10-12 NOTE — Discharge Instructions (Signed)
[facility] Providers   [facility]             [address], [place], [place] [address] [phone]  [facility]                                                             [address], [address], [place], [place], [address] [phone]  [facility]                                    [address], [address], [place], [place], [address] [phone]  [facility] [place] [address], [address], [place], [place], [address] [phone]  [facility]                                 [address], [place], [place], [address] [phone]  [facility]                                    [address], [place], [place], [address] [phone]  [facility] [address], [address], [place], [place], [address]                              [facility] [address], [address], [place], [place], [address] [phone]  [facility]         Phone: [phone]  [facility]      Phone: [phone]   [facility]      Phone: [phone]  [facility]         Phone: [phone]   [facility]    Phone: [phone]  [facility]      Phone: [phone]  [facility]     Phone: [phone]  [facility]        Phone: [phone]  [facility]      Phone: [phone]                   Safe Medications in Pregnancy    Acne: Benzoyl Peroxide Salicylic Acid  Backache/Headache: Tylenol: 2 regular strength every 4 hours OR              2 Extra strength every 6  hours  Colds/Coughs/Allergies: Benadryl (alcohol free) 25 mg every 6 hours as needed Breath right strips Claritin Cepacol throat lozenges Chloraseptic throat spray Cold-Eeze- up to three times per day Cough drops, alcohol free Flonase (by prescription only) Guaifenesin Mucinex Robitussin DM (plain only, alcohol free) Saline nasal spray/drops Sudafed (pseudoephedrine) & Actifed ** use only after [redacted] weeks gestation and if you do not have high blood pressure Tylenol Vicks Vaporub Zinc lozenges Zyrtec   Constipation: Colace Ducolax suppositories Fleet enema Glycerin   suppositories Metamucil Milk of magnesia Miralax Senokot Smooth move tea  Diarrhea: Kaopectate Imodium A-D  *NO pepto Bismol  Hemorrhoids: Anusol Anusol HC Preparation H Tucks  Indigestion: Tums Maalox Mylanta Zantac  Pepcid  Insomnia: Benadryl (alcohol free) 25mg every 6 hours as needed Tylenol PM Unisom, no Gelcaps  Leg Cramps: Tums MagGel  Nausea/Vomiting:  Bonine Dramamine Emetrol Ginger extract Sea bands Meclizine  Nausea medication to take during pregnancy:  Unisom (doxylamine succinate 25 mg tablets) Take one tablet daily at bedtime. If symptoms are not adequately controlled, the dose can be increased to a maximum recommended dose of two tablets daily (1/2 tablet in the morning, 1/2 tablet mid-afternoon and one at bedtime). Vitamin B6 100mg tablets. Take one tablet twice a day (up to 200 mg per day).  Skin Rashes: Aveeno products Benadryl cream or 25mg every 6 hours as needed Calamine Lotion 1% cortisone cream  Yeast infection: Gyne-lotrimin 7 Monistat 7   **If taking multiple medications, please check labels to avoid duplicating the same active ingredients **take medication as directed on the label ** Do not exceed 4000 mg of tylenol in 24 hours **Do not take medications that contain aspirin or ibuprofen    

## 2021-10-12 NOTE — MAU Provider Note (Signed)
History     CSN: [id number]  Arrival date and time: 10/12/21 2043   Event Date/Time   First Provider Initiated Contact with Patient 10/12/21 2303      Chief Complaint  Patient presents with   Abdominal Pain   26 y.o. G2P1001 @[redacted]w[redacted]d  by LMP presenting with abdominal cramping. Reports onset 3 days ago. Pain is intermittent and happens when she sneezes or after eating. Rates pain 7/10. Has not treated it. Denies VB or discharge. Denies urinary sx. Reports some constipation but had a runny stool yesterday. Has daily morning sickness with vomiting 1-2 times a day.    OB History     Gravida  2   Para  1   Term  1   Preterm      AB      Living  1      SAB      IAB      Ectopic      Multiple      Live Births  1           Past Medical History:  Diagnosis Date   Herpes, genital    Medical history non-contributory     Past Surgical History:  Procedure Laterality Date   CESAREAN SECTION     TONSILLECTOMY      Family History  Problem Relation Age of Onset   Diabetes Maternal Grandmother     Social History   Tobacco Use   Smoking status: Never   Smokeless tobacco: Never  Vaping Use   Vaping Use: Some days  Substance Use Topics   Alcohol use: Never   Drug use: Not Currently    Types: Marijuana    Allergies:  Allergies  Allergen Reactions   Other Swelling   Shellfish Allergy Itching   Shrimp Extract Allergy Skin Test Rash    No medications prior to admission.    Review of Systems  Constitutional:  Negative for fever.  Gastrointestinal:  Positive for abdominal pain, constipation, nausea and vomiting.  Genitourinary:  Negative for dysuria, frequency, hematuria, urgency, vaginal bleeding and vaginal discharge.  Physical Exam   Blood pressure 125/75, pulse 82, temperature 98.7 F (37.1 C), temperature source Oral, resp. rate 20, height 5\' 7"  (1.702 m), weight (!) 141.2 kg, last menstrual period 09/01/2021, SpO2 99 %.  Physical Exam Vitals  and nursing note reviewed.  Constitutional:      General: She is not in acute distress (appears comfortable).    Appearance: Normal appearance.  HENT:     Head: Normocephalic and atraumatic.  Cardiovascular:     Rate and Rhythm: Normal rate.  Pulmonary:     Effort: Pulmonary effort is normal. No respiratory distress.  Abdominal:     General: There is no distension.     Palpations: Abdomen is soft. There is no mass.     Tenderness: There is no abdominal tenderness. There is no guarding or rebound.     Hernia: No hernia is present.  Musculoskeletal:        General: Normal range of motion.     Cervical back: Normal range of motion.  Skin:    General: Skin is warm and dry.  Neurological:     General: No focal deficit present.     Mental Status: She is alert and oriented to person, place, and time.  Psychiatric:        Mood and Affect: Mood normal.        Behavior: Behavior normal.   Results  for orders placed or performed during the hospital encounter of 10/12/21 (from the past 24 hour(s))  POC Urine Pregnancy, ED (not at [facility])     Status: Abnormal   Collection Time: 10/12/21  8:59 PM  Result Value Ref Range   Preg Test, Ur POSITIVE (A) NEGATIVE  Urinalysis, Routine w reflex microscopic Urine, Clean Catch     Status: Abnormal   Collection Time: 10/12/21  9:51 PM  Result Value Ref Range   Color, Urine YELLOW YELLOW   APPearance HAZY (A) CLEAR   Specific Gravity, Urine 1.029 1.005 - 1.030   pH 6.0 5.0 - 8.0   Glucose, UA NEGATIVE NEGATIVE mg/dL   Hgb urine dipstick NEGATIVE NEGATIVE   Bilirubin Urine NEGATIVE NEGATIVE   Ketones, ur NEGATIVE NEGATIVE mg/dL   Protein, ur NEGATIVE NEGATIVE mg/dL   Nitrite NEGATIVE NEGATIVE   Leukocytes,Ua SMALL (A) NEGATIVE   RBC / HPF 0-5 0 - 5 RBC/hpf   WBC, UA 6-10 0 - 5 WBC/hpf   Bacteria, UA RARE (A) NONE SEEN   Squamous Epithelial / LPF 11-20 0 - 5   Mucus PRESENT   Wet prep, genital     Status: None   Collection Time: 10/12/21 10:15 PM   Result Value Ref Range   Yeast Wet Prep HPF POC NONE SEEN NONE SEEN   Trich, Wet Prep NONE SEEN NONE SEEN   Clue Cells Wet Prep HPF POC NONE SEEN NONE SEEN   WBC, Wet Prep HPF POC <10 <10   Sperm NONE SEEN   ABO/Rh     Status: None   Collection Time: 10/12/21 11:12 PM  Result Value Ref Range   ABO/RH(D) B POS    No rh immune globuloin      NOT A RH IMMUNE GLOBULIN CANDIDATE, PT RH POSITIVE Performed at [facility], [address] [place], [place] [address]   CBC     Status: Abnormal   Collection Time: 10/12/21 11:12 PM  Result Value Ref Range   WBC 8.2 4.0 - 10.5 K/uL   RBC 4.00 3.87 - 5.11 MIL/uL   Hemoglobin 11.5 (L) 12.0 - 15.0 g/dL   HCT 35.2 (L) 36.0 - 46.0 %   MCV 88.0 80.0 - 100.0 fL   MCH 28.8 26.0 - 34.0 pg   MCHC 32.7 30.0 - 36.0 g/dL   RDW 14.5 11.5 - 15.5 %   Platelets 330 150 - 400 K/uL   nRBC 0.0 0.0 - 0.2 %   US OB LESS THAN 14 WEEKS WITH OB TRANSVAGINAL  Result Date: 10/12/2021 CLINICAL DATA:  Lower abdominal pain. EXAM: OBSTETRIC <14 WK [place] AND TRANSVAGINAL OB US TECHNIQUE: Both transabdominal and transvaginal ultrasound examinations were performed for complete evaluation of the gestation as well as the maternal uterus, adnexal regions, and pelvic cul-de-sac. Transvaginal technique was performed to assess early pregnancy. COMPARISON:  None. FINDINGS: Intrauterine gestational sac: Single Yolk sac:  Not Visualized. Embryo:  Visualized. Cardiac Activity: Visualized. Heart Rate: 142 bpm CRL:  7.7 mm   6 w   4 d                  [place] EDC: 06/03/2022 Subchorionic hemorrhage:  None visualized. Maternal uterus/adnexae: Bilateral ovaries appear within normal limits. No free fluid. IMPRESSION: 1. Single live intrauterine gestation measuring 6 weeks 4 days by crown-rump length. Electronically Signed   By: Ronney Asters M.D.   On: 10/12/2021 23:04    MAU Course  Procedures  MDM Labs and  [place] ordered and reviewed. Viable IUP on [place]. No signs of acute process. Stable for  discharge home.   Assessment and Plan   1. Pelvic pain affecting pregnancy in first trimester, antepartum   2. Abdominal pain affecting pregnancy   3. [redacted] weeks gestation of pregnancy    Discharge home Follow up with OB provider of choice to start [place] provided SAB precautions Safe meds list OB provider list Pregnancy verification letter  Allergies as of 10/12/2021       Reactions   Other Swelling   Shellfish Allergy Itching   Shrimp Extract Allergy Skin Test Rash        Medication List     STOP taking these medications    cyclobenzaprine 5 MG tablet Commonly known as: FLEXERIL   ibuprofen 800 MG tablet Commonly known as: ADVIL   metroNIDAZOLE 500 MG tablet Commonly known as: FLAGYL   ondansetron 4 MG disintegrating tablet Commonly known as: Zofran ODT       Julianne Handler, CNM 10/12/2021, 11:58 PM

## 2021-10-12 NOTE — ED Triage Notes (Signed)
Pt arrives POV for eval of severe lower abd cramping. Pt reports she took a pregnancy test Friday and it came out positive. Pt denies vaginal bleeding. Reports LMP 12/27. Reports this is her second pregnancy

## 2021-10-12 NOTE — ED Provider Notes (Signed)
Emergency Medicine Provider OB Triage Evaluation Note  Joanna Buchanan is a 26 y.o. female, G2, P1 5 weeks 6 days by LMP who presents today for evaluation of pelvic pain.  She took a test on Friday that was positive.  She states that her pain started Friday.  It is worse with walking or sneezing.  No prenatal care so far this pregnancy.     Physical Exam  BP (!) 141/81    Pulse (!) 101    Temp 98.4 F (36.9 C) (Oral)    Resp 16    Ht 5\' 9"  (1.753 m)    Wt 128 kg    SpO2 100%    BMI 41.67 kg/m  General: Awake, no distress  HEENT: Atraumatic  Resp: Normal effort  Cardiac: Normal rate Abd: Nondistended, nontender  MSK: Moves all extremities without difficulty Neuro: Speech clear  Medical Decision Making  Pt evaluated for pregnancy concern and is stable for transfer to MAU. Pt is in agreement with plan for transfer.  9:06 PM Discussed with MAU APP, , who accepts patient in transfer.  Clinical Impression   1. Pelvic pain affecting pregnancy in first trimester, antepartum        Joanna Buchanan 10/12/21 2106    2107, MD 10/12/21 2157

## 2021-10-13 LAB — GC/CHLAMYDIA PROBE AMP (~~LOC~~) NOT AT ARMC
Chlamydia: NEGATIVE
Comment: NEGATIVE
Comment: NORMAL
Neisseria Gonorrhea: NEGATIVE

## 2021-10-13 LAB — HCG, QUANTITATIVE, PREGNANCY: hCG, Beta Chain, Quant, S: 28520 m[IU]/mL — ABNORMAL HIGH (ref <5)

## 2021-12-19 ENCOUNTER — Inpatient Hospital Stay (HOSPITAL_COMMUNITY)
Admission: AD | Admit: 2021-12-19 | Discharge: 2021-12-19 | Disposition: A | Discharge status: Home / Self Care | Payer: Medicaid Other | Attending: Obstetrics & Gynecology | Admitting: Obstetrics & Gynecology

## 2021-12-19 ENCOUNTER — Encounter (HOSPITAL_COMMUNITY): Payer: Self-pay | Admitting: Obstetrics & Gynecology

## 2021-12-19 ENCOUNTER — Other Ambulatory Visit: Payer: Self-pay

## 2021-12-19 DIAGNOSIS — R519 Headache, unspecified: Secondary | ICD-10-CM | POA: Diagnosis not present

## 2021-12-19 DIAGNOSIS — R102 Pelvic and perineal pain: Secondary | ICD-10-CM | POA: Diagnosis not present

## 2021-12-19 DIAGNOSIS — Z3A15 15 weeks gestation of pregnancy: Secondary | ICD-10-CM

## 2021-12-19 DIAGNOSIS — O26892 Other specified pregnancy related conditions, second trimester: Secondary | ICD-10-CM | POA: Diagnosis present

## 2021-12-19 DIAGNOSIS — O26899 Other specified pregnancy related conditions, unspecified trimester: Secondary | ICD-10-CM

## 2021-12-19 LAB — WET PREP, GENITAL
Clue Cells Wet Prep HPF POC: NONE SEEN
Sperm: NONE SEEN
Trich, Wet Prep: NONE SEEN
WBC, Wet Prep HPF POC: <10 (ref <10)
Yeast Wet Prep HPF POC: NONE SEEN

## 2021-12-19 LAB — URINALYSIS, ROUTINE W REFLEX MICROSCOPIC
Bilirubin Urine: NEGATIVE
Glucose, UA: NEGATIVE mg/dL
Hgb urine dipstick: NEGATIVE
Ketones, ur: NEGATIVE mg/dL
Leukocytes,Ua: NEGATIVE
Nitrite: NEGATIVE
Protein, ur: NEGATIVE mg/dL
Specific Gravity, Urine: 1.023 (ref 1.005–1.030)
pH: 7 (ref 5.0–8.0)

## 2021-12-19 MED ORDER — ACETAMINOPHEN-CAFFEINE 500-65 MG PO TABS
2.0000 | ORAL_TABLET | Freq: Once | ORAL | Status: AC
  Administered 2021-12-19: 2 via ORAL
  Filled 2021-12-19: qty 2

## 2021-12-19 NOTE — MAU Provider Note (Signed)
?History  ?  ? ?[id number] ? ?Arrival date and time: 12/19/21 1145 ?  ? ?Chief Complaint  ?Patient presents with  ? Abdominal Pain  ? Nasal Congestion  ? ? ? ?HPI ?Joanna Buchanan is a 26 y.o. at [redacted]w[redacted]d by LMP with PMHx notable for previous c/section & HSV, who presents for abdominal cramping, congestion, and headache. ?Reports intermittent lower abdominal cramping for the last week. Pain is intermittent. Also occasionally has sharp pains that occur with walking and movement. Was constipated at the beginning of the week but has had a normal bowel movement since then. Denies vomiting, diarrhea, dysuria, vaginal bleeding, or vaginal discharge.  ?Has had nasal & chest congestion for the last few weeks. Associates it with allergies. Denies asthma and doesn't smoke. No fevers, sore throat, cough, or chest pain. Says the congestion sometimes makes it difficult to swallow and breathe.  ?Has had intermittent headaches since finding out she was pregnant. Hasn't been treating them. Most recent headache started this morning. Rates 9/10. Hasn't treated.  ?Does not have an ob yet.  ? ?OB History   ? ? Gravida  ?2  ? Para  ?1  ? Term  ?1  ? Preterm  ?   ? AB  ?   ? Living  ?1  ?  ? ? SAB  ?   ? IAB  ?   ? Ectopic  ?   ? Multiple  ?   ? Live Births  ?1  ?   ?  ?  ? ? ?Past Medical History:  ?Diagnosis Date  ? Herpes, genital   ? ? ?Past Surgical History:  ?Procedure Laterality Date  ? CESAREAN SECTION    ? TONSILLECTOMY    ? ? ?Family History  ?Problem Relation Age of Onset  ? Diabetes Maternal Grandmother   ? ? ?Allergies  ?Allergen Reactions  ? Other Swelling  ? Shellfish Allergy Itching  ? Shrimp Extract Allergy Skin Test Rash  ? ? ?No current facility-administered medications on file prior to encounter.  ? ?No current outpatient medications on file prior to encounter.  ? ? ? ?ROS ?Pertinent positives and negative per HPI, all others reviewed and negative ? ?Physical Exam  ? ?BP 109/80 (BP Location: Left Arm)   Pulse 80    Temp 98.3 ?F (36.8 ?C) (Oral)   Resp 16   Ht 5\' 7"  (1.702 m)   LMP 09/01/2021   SpO2 99%   BMI 48.76 kg/m?  ? ?Patient Vitals for the past 24 hrs: ? BP Temp Temp src Pulse Resp SpO2 Height  ?12/19/21 1325 109/80 -- -- 80 16 -- --  ?12/19/21 1205 130/89 98.3 ?F (36.8 ?C) Oral 92 17 99 % 5\' 7"  (1.702 m)  ? ? ?Physical Exam ?Vitals and nursing note reviewed. Exam conducted with a chaperone present.  ?Constitutional:   ?   General: She is not in acute distress. ?   Appearance: She is well-developed. She is not ill-appearing.  ?HENT:  ?   Head: Normocephalic and atraumatic.  ?Eyes:  ?   General: No scleral icterus. ?   Pupils: Pupils are equal, round, and reactive to light.  ?Cardiovascular:  ?   Rate and Rhythm: Normal rate and regular rhythm.  ?Pulmonary:  ?   Effort: Pulmonary effort is normal. No respiratory distress.  ?   Breath sounds: Normal breath sounds. No wheezing.  ?Abdominal:  ?   Palpations: Abdomen is soft.  ?   Tenderness: There is no abdominal tenderness.  There is no rebound.  ?Genitourinary: ?   Comments: Cervix closed/thick ?Skin: ?   General: Skin is warm and dry.  ?Neurological:  ?   Mental Status: She is alert.  ?Psychiatric:     ?   Mood and Affect: Mood normal.     ?   Behavior: Behavior normal.  ?  ? ? ?Labs ?Results for orders placed or performed during the hospital encounter of 12/19/21 (from the past 24 hour(s))  ?Urinalysis, Routine w reflex microscopic Urine, Clean Catch     Status: Abnormal  ? Collection Time: 12/19/21 12:36 PM  ?Result Value Ref Range  ? Color, Urine YELLOW YELLOW  ? APPearance HAZY (A) CLEAR  ? Specific Gravity, Urine 1.023 1.005 - 1.030  ? pH 7.0 5.0 - 8.0  ? Glucose, UA NEGATIVE NEGATIVE mg/dL  ? Hgb urine dipstick NEGATIVE NEGATIVE  ? Bilirubin Urine NEGATIVE NEGATIVE  ? Ketones, ur NEGATIVE NEGATIVE mg/dL  ? Protein, ur NEGATIVE NEGATIVE mg/dL  ? Nitrite NEGATIVE NEGATIVE  ? Leukocytes,Ua NEGATIVE NEGATIVE  ?Wet prep, genital     Status: None  ? Collection Time:  12/19/21 12:56 PM  ?Result Value Ref Range  ? Yeast Wet Prep HPF POC NONE SEEN NONE SEEN  ? Trich, Wet Prep NONE SEEN NONE SEEN  ? Clue Cells Wet Prep HPF POC NONE SEEN NONE SEEN  ? WBC, Wet Prep HPF POC <10 <10  ? Sperm NONE SEEN   ? ? ?Imaging ?No results found. ? ?MAU Course  ?Procedures ?Lab Orders    ?     Wet prep, genital    ?     Urinalysis, Routine w reflex microscopic Urine, Clean Catch    ? ?Meds ordered this encounter  ?Medications  ? Acetaminophen-Caffeine 500-65 MG TABS 2 tablet  ? ?Imaging Orders  ?No imaging studies ordered today  ? ? ?MDM ?FHT present ? ?Description of pain consistent with round ligament pain. Cervix closed & FHT present. Negative u/a and wet prep. GC/CT pending.  ? ?Congestion & increased mucous started with recent high pollen in the area. Doesn't take anything for allergies. SpO2 99%, patient in no distress, and lungs sound clear throughout. Reviewed treatment with OTC meds.  ? ?Given tylenol/caffeine for headache. Patient states she doesn't want to wait for relief and requesting to be discharged at this time.  ?Assessment and Plan  ? ?1. Pain of round ligament affecting pregnancy, antepartum  ?-Reviewed reasons to return to MAU ?-GC/CT pending  ?2. Headache in pregnancy, antepartum, second trimester  ?-Given list of OTC meds safe in pregnancy ?-Can start antihistamine for increased mucous  ?3. [redacted] weeks gestation of pregnancy  ?-Given list of OB providers  ? ? ? ?Judeth Horn, NP ?12/19/21 ?1:27 PM ? ? ?

## 2021-12-19 NOTE — MAU Note (Signed)
Pt reports to mau with c/o lower abd cramping for the past 2 weeks.  Pt also reports some nasal congestion and cough for the past 3 weeks.  Denies vag bleeding.  Pt states she has not taken any medication today.  ?

## 2021-12-19 NOTE — Discharge Instructions (Signed)
?  [facility] Providers  ? ? ? ? ? ?   ?[facility]  [address], [place], [place] [address]  [phone]  ?[facility]  [address], [place], [place] [address]  [phone]  ?[facility]  [address], , [place] [address]  [phone]  ?[facility] [place]  [address], [place], [place] [address]  [phone]  ?[facility]  [address]), [place], [place] [address]  [phone]  ?[facility]  [address], [place], [place] [address]  [phone]  ?[facility]  [address], [place], [place] [address]  [phone]  ?[facility]  [address], [place], [place] [address]  [phone]  ?[facility]  [address], [place], [place] [address]  [phone]  ?[facility]  [address], [place], [place] [address]  [phone]  ?[facility]  [address], [place], [place] [address]  [phone]  ?[facility]   [address], [place], [place] [address]  [phone]  ?[facility]  [address], , [place] [address]   [phone]  ?[facility]  [address], ,  [address]  [phone]  ? ? ? ? ? ? ?Safe Medications in Pregnancy  ? ?Acne: ?Benzoyl Peroxide ?Salicylic Acid ? ?Backache/Headache: ?Tylenol: 2 regular strength every 4 hours OR ?             2 Extra strength every 6 hours ? ?Colds/Coughs/Allergies: ?Benadryl (alcohol free) 25 mg every 6 hours as needed ?Breath right strips ?Claritin ?Cepacol throat lozenges ?Chloraseptic throat spray ?Cold-Eeze- up to three times per day ?Cough drops, alcohol free ?Flonase (by prescription only) ?Guaifenesin ?Mucinex ?Robitussin DM (plain only, alcohol free) ?Saline nasal  spray/drops ?Sudafed (pseudoephedrine) & Actifed ** use only after [redacted] weeks gestation and if you do not have high blood pressure ?Tylenol ?Vicks Vaporub ?Zinc lozenges ?Zyrtec  ? ?Constipation: ?Colace ?Ducolax suppositories ?Fleet enema ?Glycerin suppositories ?Metamucil ?Milk of magnesia ?Miralax ?Senokot ?Smooth move tea ? ?Diarrhea: ?Kaopectate ?Imodium A-D ? ?*NO pepto Bismol ? ?Hemorrhoids: ?Anusol ?Anusol HC ?Preparation H ?Tucks ? ?Indigestion: ?Tums ?Maalox ?Mylanta ?Zantac  ?Pepcid ? ?Insomnia: ?Benadryl (alcohol free) 25mg every 6 hours as needed ?Tylenol PM ?Unisom, no Gelcaps ? ?Leg Cramps: ?Tums ?MagGel ? ?Nausea/Vomiting:  ?Bonine ?Dramamine ?Emetrol ?Ginger extract ?Sea bands ?Meclizine  ?Nausea medication to take during pregnancy:  ?Unisom (doxylamine succinate 25 mg tablets) Take one tablet daily at bedtime. If symptoms are not adequately controlled, the dose can be increased to a maximum recommended dose of two tablets daily (1/2 tablet in the morning, 1/2 tablet mid-afternoon and one at bedtime). ?Vitamin B6 100mg tablets. Take one tablet twice a day (up to 200 mg per day). ? ?Skin Rashes: ?Aveeno products ?Benadryl cream or 25mg every 6 hours as needed ?Calamine Lotion ?1% cortisone cream ? ?Yeast infection: ?Gyne-lotrimin 7 ?Monistat 7 ? ?Gum/tooth pain: ?Anbesol ? ?**If taking multiple medications, please check labels to avoid duplicating the same active ingredients ?**take medication as directed on the label ?** Do not exceed 4000 mg of tylenol in 24 hours ?**Do not take medications that contain aspirin or ibuprofen ? ? ? ?

## 2021-12-21 LAB — GC/CHLAMYDIA PROBE AMP (~~LOC~~) NOT AT ARMC
Chlamydia: NEGATIVE
Comment: NEGATIVE
Comment: NORMAL
Neisseria Gonorrhea: NEGATIVE

## 2022-01-12 ENCOUNTER — Telehealth: Payer: Self-pay

## 2022-01-12 ENCOUNTER — Telehealth: Payer: Medicaid Other

## 2022-01-12 NOTE — Telephone Encounter (Signed)
Called Pt to start New OB Intake My Chart visit, no answer, left VM for call back.  ?

## 2022-01-12 NOTE — Telephone Encounter (Signed)
2nd attempt to do My Chart visit, still no answer. ?

## 2022-02-04 ENCOUNTER — Encounter: Payer: Self-pay | Admitting: Student

## 2022-02-04 ENCOUNTER — Ambulatory Visit (INDEPENDENT_AMBULATORY_CARE_PROVIDER_SITE_OTHER): Payer: Medicaid Other | Admitting: Student

## 2022-02-04 VITALS — BP 122/77 | HR 102 | Wt 312.3 lb

## 2022-02-04 DIAGNOSIS — O0930 Supervision of pregnancy with insufficient antenatal care, unspecified trimester: Secondary | ICD-10-CM | POA: Diagnosis not present

## 2022-02-04 DIAGNOSIS — Z3A22 22 weeks gestation of pregnancy: Secondary | ICD-10-CM | POA: Diagnosis not present

## 2022-02-04 DIAGNOSIS — Z59819 Housing instability, housed unspecified: Secondary | ICD-10-CM

## 2022-02-04 DIAGNOSIS — Z3480 Encounter for supervision of other normal pregnancy, unspecified trimester: Secondary | ICD-10-CM | POA: Insufficient documentation

## 2022-02-04 MED ORDER — VALACYCLOVIR HCL 500 MG PO TABS: 500.0000 mg | ORAL_TABLET | Freq: Two times a day (BID) | ORAL | 1 refills | Status: DC

## 2022-02-04 NOTE — Progress Notes (Unsigned)
Patient declined IBH at this time.  Wynona Canes, CMA

## 2022-02-04 NOTE — Progress Notes (Unsigned)
  Subjective:    Joanna Buchanan is being seen today for her first obstetrical visit.  This is not a planned pregnancy. She is at [redacted]w[redacted]d gestation. Her obstetrical history is significant for BMI. She has a history of Relationship with FOB: significant other, living together. Patient does intend to breast feed. Pregnancy history fully reviewed.  She has a history of c/section for Herpes outbreak; she also reports that she wants a VBAC.   Patient reports no complaints.  Review of Systems:   Review of Systems  Constitutional: Negative.   HENT: Negative.    Respiratory: Negative.    Cardiovascular: Negative.   Gastrointestinal: Negative.   Genitourinary: Negative.   Neurological: Negative.   Hematological: Negative.   Psychiatric/Behavioral: Negative.     Objective:     BP 122/77   Pulse (!) 102   Wt (!) 312 lb 4.8 oz (141.7 kg)   LMP 09/01/2021   BMI 48.91 kg/m  Physical Exam Constitutional:      Appearance: Normal appearance.  Cardiovascular:     Rate and Rhythm: Normal rate.  Pulmonary:     Effort: Pulmonary effort is normal.  Abdominal:     General: Abdomen is flat.  Skin:    General: Skin is warm.  Neurological:     General: No focal deficit present.     Mental Status: She is alert.    Exam    Assessment:    Pregnancy: G2P1001 Patient Active Problem List   Diagnosis Date Noted   Supervision of other normal pregnancy, antepartum 02/04/2022   Late prenatal care 02/04/2022       Plan:     Initial labs drawn. Prenatal vitamins. Problem list reviewed and updated. AFP3 discussed: ordered. Role of ultrasound in pregnancy discussed; fetal survey: requested. Amniocentesis discussed: not indicated. Follow up in 4 weeks. 75% of  min visit spent on counseling and coordination of care.  DOES NOT want Nexplanon -will meet with pregnancy navigator -Valtrex RX sent  -discussed VBAC option, patient is interested -welcomed patient to practice  Charlesetta Garibaldi [facility] 02/04/2022

## 2022-02-05 ENCOUNTER — Encounter: Payer: Self-pay | Admitting: Student

## 2022-02-05 DIAGNOSIS — Z59819 Housing instability, housed unspecified: Secondary | ICD-10-CM | POA: Insufficient documentation

## 2022-02-05 LAB — CBC/D/PLT+RPR+RH+ABO+RUBIGG...
Antibody Screen: NEGATIVE
Basophils Absolute: 0 10*3/uL (ref 0.0–0.2)
Basos: 0 %
EOS (ABSOLUTE): 0.2 10*3/uL (ref 0.0–0.4)
Eos: 2 %
HCV Ab: NONREACTIVE
HIV Screen 4th Generation wRfx: NONREACTIVE
Hematocrit: 34.9 % (ref 34.0–46.6)
Hemoglobin: 11.6 g/dL (ref 11.1–15.9)
Hepatitis B Surface Ag: NEGATIVE
Immature Grans (Abs): 0 10*3/uL (ref 0.0–0.1)
Immature Granulocytes: 1 %
Lymphocytes Absolute: 2.3 10*3/uL (ref 0.7–3.1)
Lymphs: 30 %
MCH: 29.7 pg (ref 26.6–33.0)
MCHC: 33.2 g/dL (ref 31.5–35.7)
MCV: 90 fL (ref 79–97)
Monocytes Absolute: 0.7 10*3/uL (ref 0.1–0.9)
Monocytes: 9 %
Neutrophils Absolute: 4.4 10*3/uL (ref 1.4–7.0)
Neutrophils: 58 %
Platelets: 286 10*3/uL (ref 150–450)
RBC: 3.9 x10E6/uL (ref 3.77–5.28)
RDW: 13.1 % (ref 11.7–15.4)
RPR Ser Ql: NONREACTIVE
Rh Factor: POSITIVE
Rubella Antibodies, IGG: 4.46 index (ref >0.99)
WBC: 7.6 10*3/uL (ref 3.4–10.8)

## 2022-02-05 LAB — HEMOGLOBIN A1C
Est. average glucose Bld gHb Est-mCnc: 117 mg/dL
Hgb A1c MFr Bld: 5.7 % — ABNORMAL HIGH (ref 4.8–5.6)

## 2022-02-05 LAB — HCV INTERPRETATION

## 2022-02-08 ENCOUNTER — Encounter: Payer: Self-pay | Admitting: *Deleted

## 2022-02-08 LAB — CULTURE, OB URINE

## 2022-02-08 LAB — URINE CULTURE, OB REFLEX

## 2022-02-11 ENCOUNTER — Other Ambulatory Visit: Payer: Self-pay | Admitting: Student

## 2022-02-11 MED ORDER — CEFADROXIL 500 MG PO CAPS: 500.0000 mg | ORAL_CAPSULE | Freq: Two times a day (BID) | ORAL | 0 refills | Status: DC

## 2022-02-19 ENCOUNTER — Telehealth: Payer: Self-pay

## 2022-02-19 NOTE — Telephone Encounter (Signed)
[facility] result shows increased carrier risk for SMA. VM left for patient. MyChart message sent.

## 2022-03-03 ENCOUNTER — Ambulatory Visit: Payer: Medicaid Other

## 2022-03-03 DIAGNOSIS — Z3480 Encounter for supervision of other normal pregnancy, unspecified trimester: Secondary | ICD-10-CM

## 2022-03-03 DIAGNOSIS — O0930 Supervision of pregnancy with insufficient antenatal care, unspecified trimester: Secondary | ICD-10-CM

## 2022-03-04 ENCOUNTER — Encounter: Payer: Medicaid Other | Admitting: Obstetrics and Gynecology

## 2022-03-05 IMAGING — CT CT ABD-PELV W/O CM
2 of 4 series · 17 of 46 positions shown, 19 images · non-contrast
Comparison: 12/01/2018

CLINICAL DATA: Right lower quadrant pain

EXAM:
CT ABDOMEN AND PELVIS WITHOUT CONTRAST
TECHNIQUE: Multidetector CT imaging of the abdomen and pelvis was performed
following the standard protocol without IV contrast.

[Series 3: ap without · axial · non-contrast · 0.90mm/px · z∈[+764,+1244]mm · 14 of 108 slices shown, 16 images]
[im 6/108  soft-tissue]
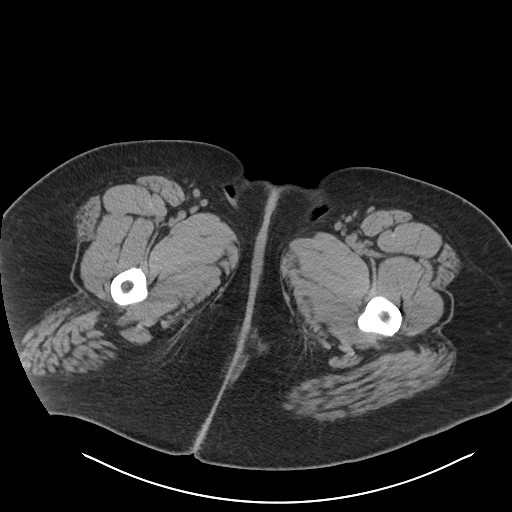
[im 6/108  bone]
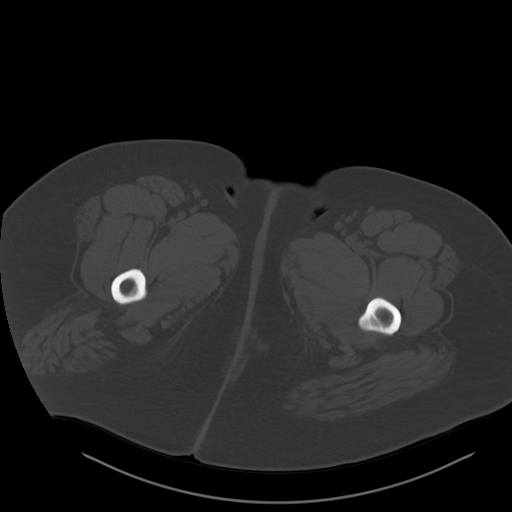
[im 12/108  soft-tissue]
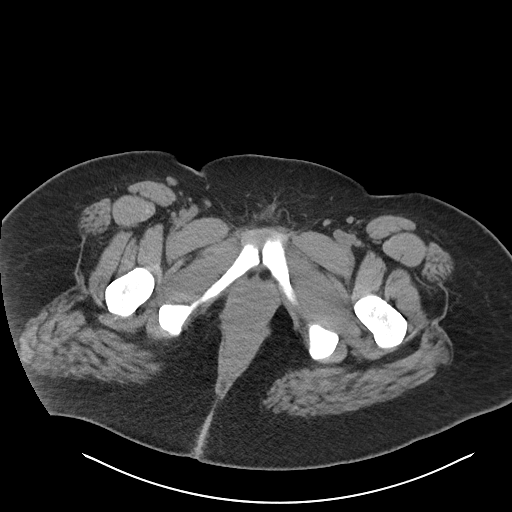
[im 23/108  soft-tissue]
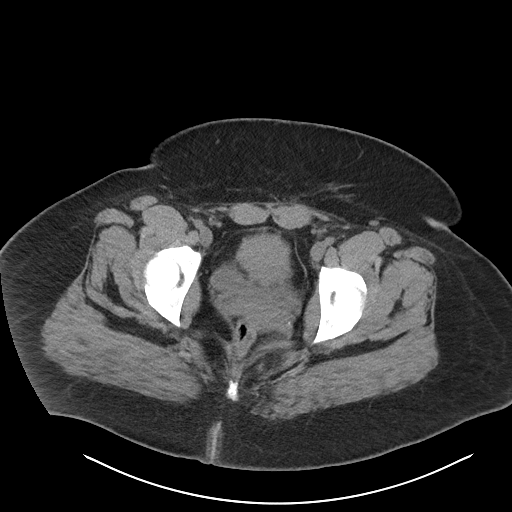
[im 29/108  soft-tissue]
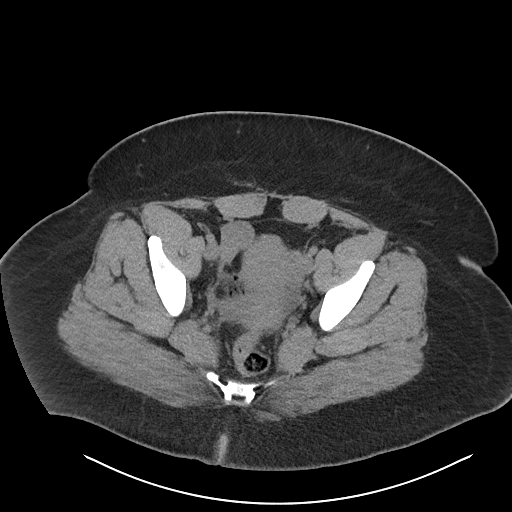
[im 34/108  soft-tissue]
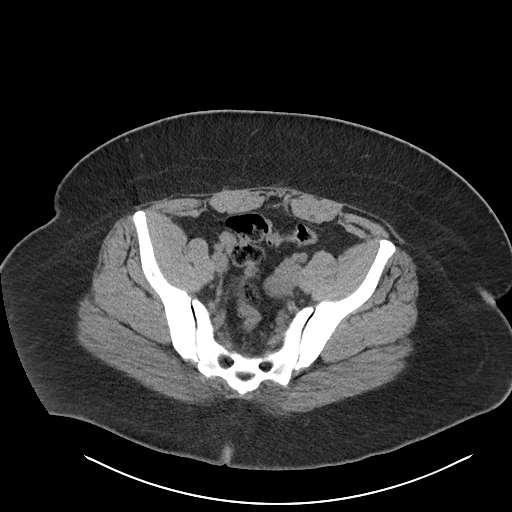
[im 46/108  soft-tissue]
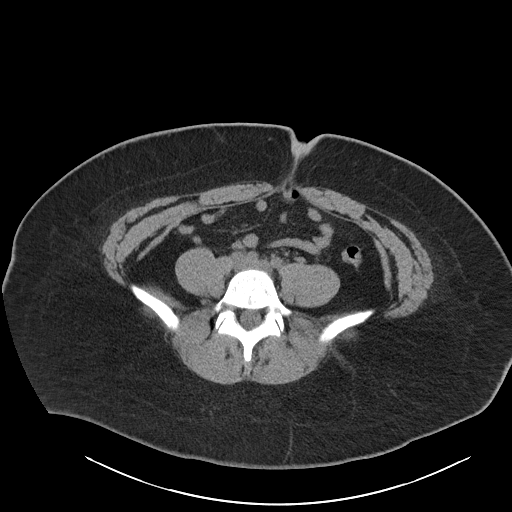
[im 51/108  soft-tissue]
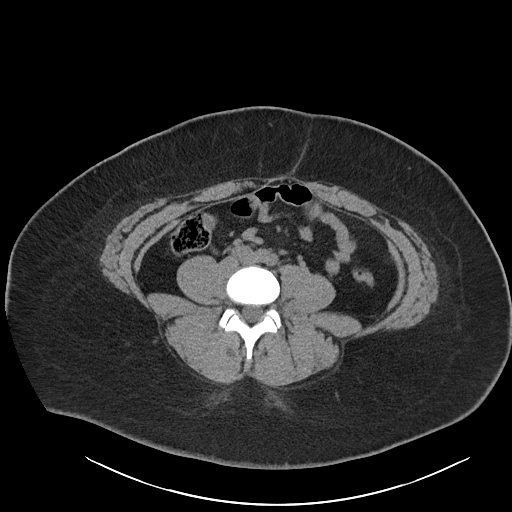
[im 57/108  soft-tissue]
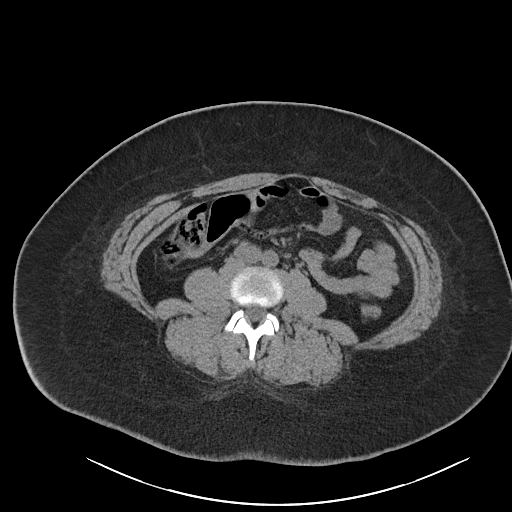
[im 62/108  soft-tissue]
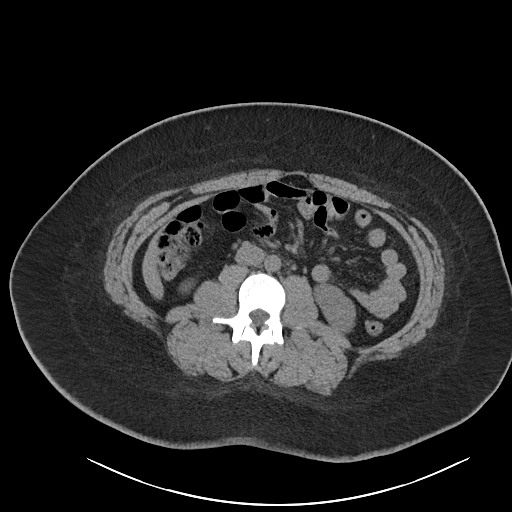
[im 62/108  bone]
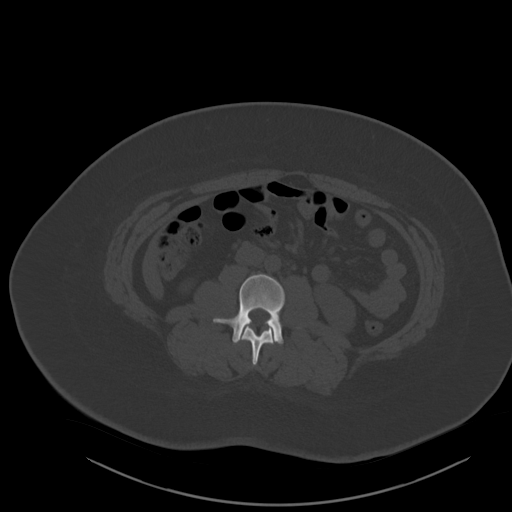
[im 74/108  soft-tissue]
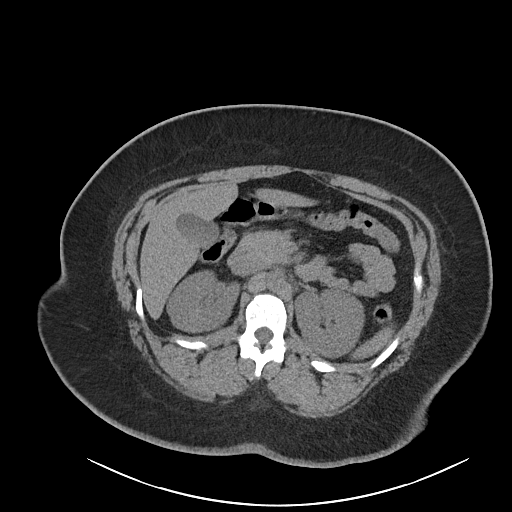
[im 79/108  soft-tissue]
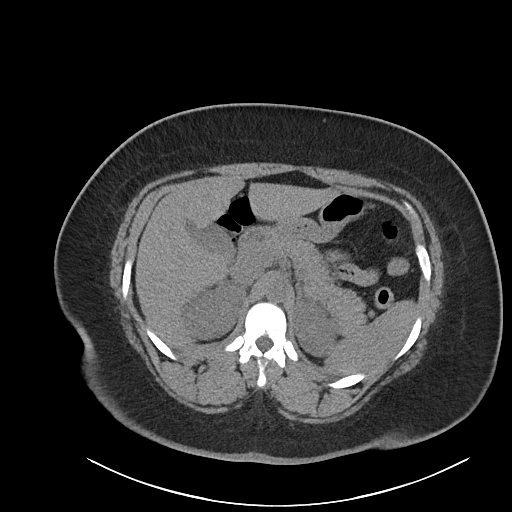
[im 85/108  soft-tissue]
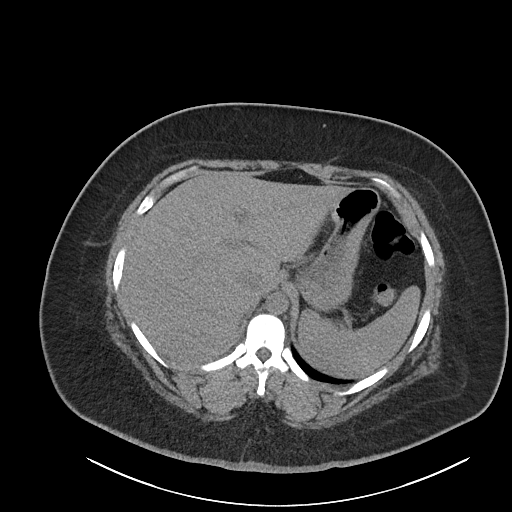
[im 96/108  soft-tissue]
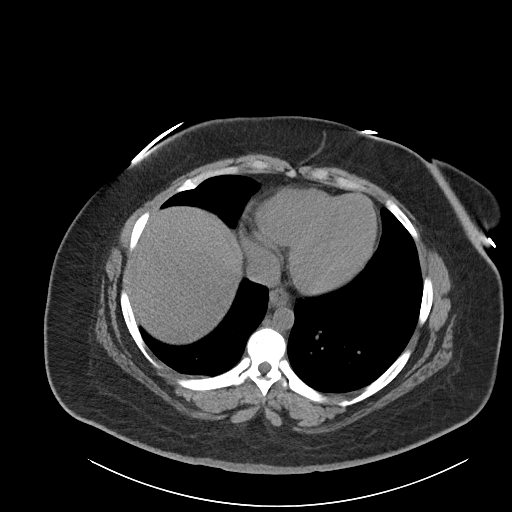
[im 102/108  soft-tissue]
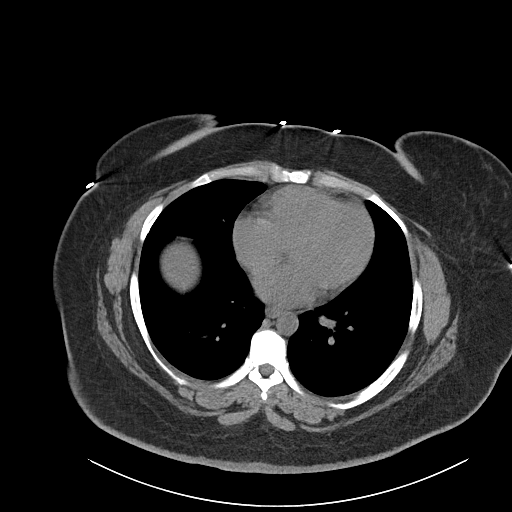

[Series 6: cor · coronal · 0.96mm/px · 3 of 113 slices shown]
[im 38/113  soft-tissue]
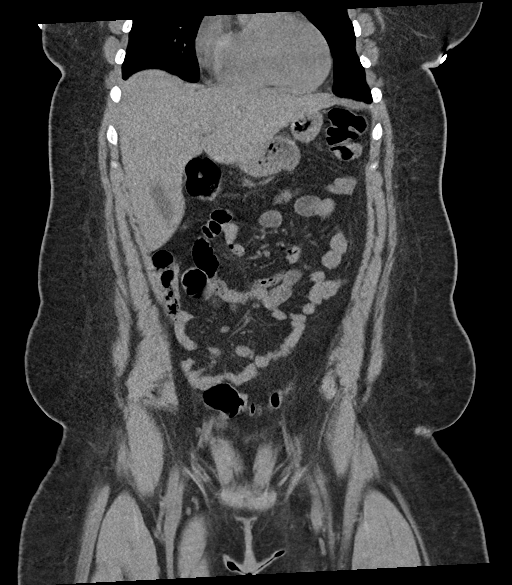
[im 50/113  soft-tissue]
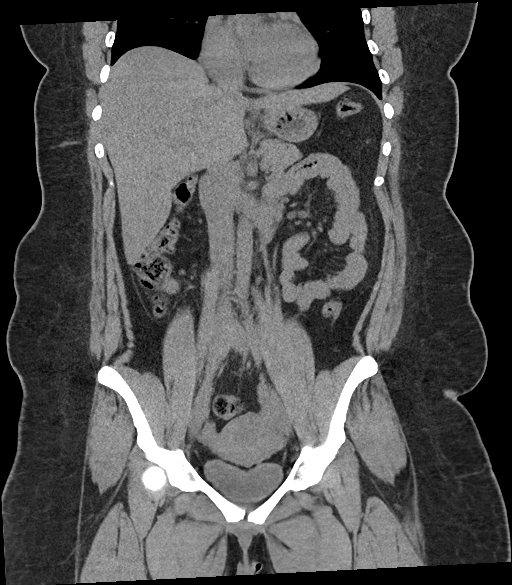
[im 63/113  soft-tissue]
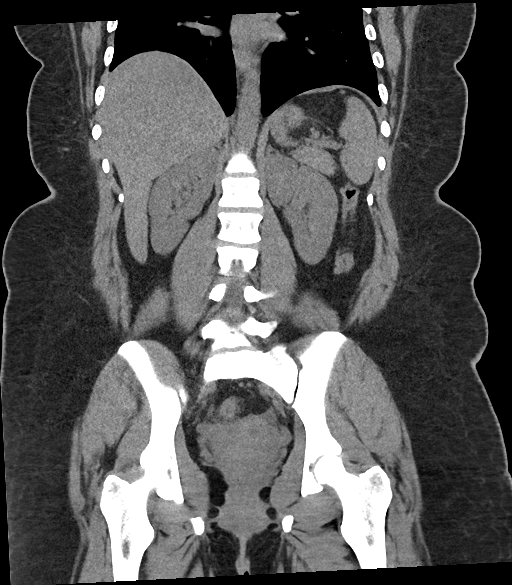

[17 of 46 positions shown; findings below may reference images not displayed]

FINDINGS: Lower chest: Lung bases are clear. No effusions. Heart is normal
size. Small hiatal hernia.

Hepatobiliary: No focal hepatic abnormality. Gallbladder
unremarkable.

Pancreas: No focal abnormality or ductal dilatation.

Spleen: No focal abnormality.  Normal size.

Adrenals/Urinary Tract: No adrenal abnormality. No focal renal
abnormality. No stones or hydronephrosis. Urinary bladder is
unremarkable.

Stomach/Bowel: Normal appendix. Stomach, large and small bowel
grossly unremarkable.

Vascular/Lymphatic: No evidence of aneurysm or adenopathy.

Reproductive: Uterus and adnexa unremarkable.  No mass.

Other: No free fluid or free air.

Musculoskeletal: No acute bony abnormality.
IMPRESSION: No acute findings in the abdomen or pelvis.

## 2022-03-10 ENCOUNTER — Encounter (HOSPITAL_COMMUNITY): Payer: Self-pay | Admitting: Obstetrics & Gynecology

## 2022-03-10 ENCOUNTER — Inpatient Hospital Stay (HOSPITAL_COMMUNITY)
Admission: AD | Admit: 2022-03-10 | Discharge: 2022-03-10 | Disposition: A | Discharge status: Home / Self Care | Payer: Medicaid Other | Attending: Obstetrics & Gynecology | Admitting: Obstetrics & Gynecology

## 2022-03-10 ENCOUNTER — Other Ambulatory Visit: Payer: Self-pay

## 2022-03-10 DIAGNOSIS — O26899 Other specified pregnancy related conditions, unspecified trimester: Secondary | ICD-10-CM

## 2022-03-10 DIAGNOSIS — R8271 Bacteriuria: Secondary | ICD-10-CM

## 2022-03-10 DIAGNOSIS — O26893 Other specified pregnancy related conditions, third trimester: Secondary | ICD-10-CM | POA: Insufficient documentation

## 2022-03-10 DIAGNOSIS — Z833 Family history of diabetes mellitus: Secondary | ICD-10-CM | POA: Diagnosis not present

## 2022-03-10 DIAGNOSIS — O99323 Drug use complicating pregnancy, third trimester: Secondary | ICD-10-CM | POA: Diagnosis not present

## 2022-03-10 DIAGNOSIS — R102 Pelvic and perineal pain: Secondary | ICD-10-CM | POA: Diagnosis not present

## 2022-03-10 DIAGNOSIS — Z3A30 30 weeks gestation of pregnancy: Secondary | ICD-10-CM | POA: Insufficient documentation

## 2022-03-10 DIAGNOSIS — O26892 Other specified pregnancy related conditions, second trimester: Secondary | ICD-10-CM

## 2022-03-10 DIAGNOSIS — Z3A27 27 weeks gestation of pregnancy: Secondary | ICD-10-CM

## 2022-03-10 DIAGNOSIS — Z3480 Encounter for supervision of other normal pregnancy, unspecified trimester: Secondary | ICD-10-CM

## 2022-03-10 DIAGNOSIS — A6000 Herpesviral infection of urogenital system, unspecified: Secondary | ICD-10-CM

## 2022-03-10 DIAGNOSIS — O0930 Supervision of pregnancy with insufficient antenatal care, unspecified trimester: Secondary | ICD-10-CM

## 2022-03-10 LAB — WET PREP, GENITAL
Clue Cells Wet Prep HPF POC: NONE SEEN
Trich, Wet Prep: NONE SEEN
WBC, Wet Prep HPF POC: <10 (ref <10)
Yeast Wet Prep HPF POC: NONE SEEN

## 2022-03-10 LAB — URINALYSIS, ROUTINE W REFLEX MICROSCOPIC
Bilirubin Urine: NEGATIVE
Glucose, UA: NEGATIVE mg/dL
Hgb urine dipstick: NEGATIVE
Ketones, ur: NEGATIVE mg/dL
Leukocytes,Ua: NEGATIVE
Nitrite: NEGATIVE
Protein, ur: NEGATIVE mg/dL
Specific Gravity, Urine: 1.026 (ref 1.005–1.030)
pH: 6 (ref 5.0–8.0)

## 2022-03-10 NOTE — MAU Note (Signed)
Joanna Buchanan is a 26 y.o. at [redacted]w[redacted]d here in MAU reporting: lower abdominal pain that began in June.  States pain is intermittent and sharp.  Denies VB or LOF.  Endorses +FM.  Reports hasn't had PNC for 2 months secondary lack of transportation. LMP: N/A Onset of complaint: June 2023 Pain score: 8/10 Vitals:   03/10/22 1235  BP: 129/73  Pulse: 87  Resp: 18  Temp: 98.1 F (36.7 C)  SpO2: 99%     BWL:[id number] Lab orders placed from triage:   UA

## 2022-03-10 NOTE — MAU Provider Note (Signed)
History     CSN: [id number]  Arrival date and time: 03/10/22 1152   Event Date/Time   First Provider Initiated Contact with Patient 03/10/22 1311      Chief Complaint  Patient presents with   Abdominal Pain   Joanna Buchanan is a 26 y.o. year old G53P1001 female at [redacted]w[redacted]d weeks gestation who presents to MAU reporting lower abdominal pain since some time in June. She describes the pain as intermittent, sharp and on both or either sides of her lower abdomen. She reports movement brings the pain on or worsens it. She denies any VB or LOF. She does not think she is at risk for vaginal infections. She endorses (+) FM today. She also wanted to know if she could be told "how long the baby is by U/S today." She receives [facility] with [facility]; no appointment since her initial appointment on 02/04/2022. She reports problems transportation being the reason for not making it to her OB office. She has MyChart and is aware of her upcoming U/S appointment on 03/26/2022. The FOB is present and contributing to the history taking.     OB History     Gravida  2   Para  1   Term  1   Preterm  0   AB  0   Living  1      SAB  0   IAB  0   Ectopic  0   Multiple  0   Live Births  1           Past Medical History:  Diagnosis Date   Herpes, genital    Medical history non-contributory     Past Surgical History:  Procedure Laterality Date   CESAREAN SECTION     TONSILLECTOMY      Family History  Problem Relation Age of Onset   Multiple sclerosis Mother    Diabetes Maternal Grandmother     Social History   Tobacco Use   Smoking status: Never   Smokeless tobacco: Never  Vaping Use   Vaping Use: Some days   Substances: CBD  Substance Use Topics   Alcohol use: Never   Drug use: Yes    Types: Marijuana    Comment: past week    Allergies:  Allergies  Allergen Reactions   Other Swelling   Shellfish Allergy Itching   Shrimp Extract Allergy Skin Test Rash    Medications  Prior to Admission  Medication Sig Dispense Refill Last Dose   Prenatal Vit-Fe Fumarate-FA (MULTIVITAMIN-PRENATAL) 27-0.8 MG TABS tablet Take 1 tablet by mouth daily at 12 noon.   Past Week   cefadroxil (DURICEF) 500 MG capsule Take 1 capsule (500 mg total) by mouth 2 (two) times daily. 14 capsule 0    valACYclovir (VALTREX) 500 MG tablet Take 1 tablet (500 mg total) by mouth 2 (two) times daily. 90 tablet 1     Review of Systems  Constitutional: Negative.   HENT: Negative.    Eyes: Negative.   Respiratory: Negative.    Cardiovascular: Negative.   Gastrointestinal: Negative.   Endocrine: Negative.   Genitourinary:  Positive for pelvic pain (intermittent, sharp on both or either side since June).  Musculoskeletal: Negative.   Skin: Negative.   Allergic/Immunologic: Negative.   Neurological: Negative.   Hematological: Negative.   Psychiatric/Behavioral: Negative.     Physical Exam   Blood pressure 129/73, pulse 87, temperature 98.1 F (36.7 C), temperature source Oral, resp. rate 18, height 5\' 7"  (1.702 m),  weight (!) 142.6 kg, last menstrual period 09/01/2021, SpO2 99 %.  Physical Exam Vitals and nursing note reviewed. Exam conducted with a chaperone present.  Constitutional:      Appearance: Normal appearance. She is obese.  Cardiovascular:     Rate and Rhythm: Normal rate.  Pulmonary:     Effort: Pulmonary effort is normal.  Abdominal:     Palpations: Abdomen is soft.  Genitourinary:    General: Normal vulva.     Comments: Pelvic exam: External genitalia normal, scant amt of malodorous, white discharge noted at introitus -- WP, GC/CT done by blind swab, Uterus is non-tender, S=D, no CMT or friability, no adnexal tenderness.  Dilation: Closed Effacement (%): Thick Cervical Position: Posterior Station: Ballotable Presentation: Undeterminable Exam by:: R. Yehia Mcbain,CNM  Musculoskeletal:        General: Normal range of motion.  Skin:    General: Skin is warm and dry.   Neurological:     Mental Status: She is alert and oriented to person, place, and time.  Psychiatric:        Mood and Affect: Mood normal.        Behavior: Behavior normal.        Thought Content: Thought content normal.        Judgment: Judgment normal.    REACTIVE NST - FHR: 135 bpm / moderate variability / accels present / decels absent / TOCO: none MAU Course  Procedures  MDM CCUA Wet Prep -- swabs done d/t malodorous vaginal area GC/CT -- swabs done d/t malodorous vaginal area  Results pending   Results for orders placed or performed during the hospital encounter of 03/10/22 (from the past 24 hour(s))  Urinalysis, Routine w reflex microscopic Urine, Clean Catch     Status: None   Collection Time: 03/10/22  1:26 PM  Result Value Ref Range   Color, Urine YELLOW YELLOW   APPearance CLEAR CLEAR   Specific Gravity, Urine 1.026 1.005 - 1.030   pH 6.0 5.0 - 8.0   Glucose, UA NEGATIVE NEGATIVE mg/dL   Hgb urine dipstick NEGATIVE NEGATIVE   Bilirubin Urine NEGATIVE NEGATIVE   Ketones, ur NEGATIVE NEGATIVE mg/dL   Protein, ur NEGATIVE NEGATIVE mg/dL   Nitrite NEGATIVE NEGATIVE   Leukocytes,Ua NEGATIVE NEGATIVE  Wet prep, genital     Status: None   Collection Time: 03/10/22  2:34 PM   Specimen: PATH Cytology Cervicovaginal Ancillary Only  Result Value Ref Range   Yeast Wet Prep HPF POC NONE SEEN NONE SEEN   Trich, Wet Prep NONE SEEN NONE SEEN   Clue Cells Wet Prep HPF POC NONE SEEN NONE SEEN   WBC, Wet Prep HPF POC <10 <10   Sperm PRESENT      Assessment and Plan  Pain of round ligament affecting pregnancy, antepartum - Information provided on RLP   Abdominal pain during pregnancy in second trimester - Information provided on abdominal pain in pregnancy   [redacted] weeks gestation of pregnancy  - Advised to call to schedule 2 hr GTT before [redacted] wks gestation - Anticipatory guidance for 2 hr GTT - advised to fast after midnight without anything to eat or drink (except for  water), will have fasting blood drawn, drink the glucola drink (flavor choices: orange or fruit punch), have a visit with a provider during the first hour of testing, wait in the waiting room to have blood drawn at 1 hour and then 2 hours after finishing glucola drink.  - Discharge patient - Patient verbalized  an understanding of the plan of care and agrees.    Raelyn Mora, CNM 03/10/2022, 1:11 PM

## 2022-03-10 NOTE — Discharge Instructions (Signed)
Please call to get scheduled for your glucose tolerance test (testing for pregnancy related diabetes - not the same as being prediabetic). That test should be done ideally by the time you reach [redacted] weeks gestation.You will need to fast after midnight without anything to eat or drink (except for water) the night before your appointment. You will have fasting blood drawn, drink the glucola drink (flavor choices: orange or fruit punch), have a visit with a provider during the first hour of testing, wait in the waiting room to have blood drawn at 1 hour and then 2 hours after finishing glucola drink.

## 2022-03-11 LAB — GC/CHLAMYDIA PROBE AMP (~~LOC~~) NOT AT ARMC
Chlamydia: NEGATIVE
Comment: NEGATIVE
Comment: NORMAL
Neisseria Gonorrhea: NEGATIVE

## 2022-03-12 ENCOUNTER — Other Ambulatory Visit: Payer: Self-pay | Admitting: Student

## 2022-03-26 ENCOUNTER — Other Ambulatory Visit: Payer: Self-pay | Admitting: *Deleted

## 2022-03-26 ENCOUNTER — Ambulatory Visit: Payer: Medicaid Other | Admitting: *Deleted

## 2022-03-26 ENCOUNTER — Ambulatory Visit: Payer: Medicaid Other | Attending: Student

## 2022-03-26 VITALS — BP 126/71 | HR 103

## 2022-03-26 DIAGNOSIS — O321XX0 Maternal care for breech presentation, not applicable or unspecified: Secondary | ICD-10-CM | POA: Insufficient documentation

## 2022-03-26 DIAGNOSIS — Z3480 Encounter for supervision of other normal pregnancy, unspecified trimester: Secondary | ICD-10-CM

## 2022-03-26 DIAGNOSIS — O99213 Obesity complicating pregnancy, third trimester: Secondary | ICD-10-CM

## 2022-03-26 DIAGNOSIS — O0933 Supervision of pregnancy with insufficient antenatal care, third trimester: Secondary | ICD-10-CM

## 2022-03-26 DIAGNOSIS — Z363 Encounter for antenatal screening for malformations: Secondary | ICD-10-CM | POA: Diagnosis present

## 2022-03-26 DIAGNOSIS — Z3A29 29 weeks gestation of pregnancy: Secondary | ICD-10-CM | POA: Insufficient documentation

## 2022-03-26 DIAGNOSIS — O0930 Supervision of pregnancy with insufficient antenatal care, unspecified trimester: Secondary | ICD-10-CM

## 2022-03-26 DIAGNOSIS — Z362 Encounter for other antenatal screening follow-up: Secondary | ICD-10-CM

## 2022-03-29 ENCOUNTER — Encounter: Payer: Self-pay | Admitting: Medical

## 2022-03-29 ENCOUNTER — Other Ambulatory Visit: Payer: Self-pay

## 2022-03-29 ENCOUNTER — Ambulatory Visit (INDEPENDENT_AMBULATORY_CARE_PROVIDER_SITE_OTHER): Payer: Medicaid Other | Admitting: Medical

## 2022-03-29 VITALS — BP 128/82 | HR 97 | Wt 311.8 lb

## 2022-03-29 DIAGNOSIS — O0930 Supervision of pregnancy with insufficient antenatal care, unspecified trimester: Secondary | ICD-10-CM

## 2022-03-29 DIAGNOSIS — R8271 Bacteriuria: Secondary | ICD-10-CM | POA: Diagnosis not present

## 2022-03-29 DIAGNOSIS — Z3A29 29 weeks gestation of pregnancy: Secondary | ICD-10-CM

## 2022-03-29 DIAGNOSIS — O24419 Gestational diabetes mellitus in pregnancy, unspecified control: Secondary | ICD-10-CM

## 2022-03-29 DIAGNOSIS — Z5941 Food insecurity: Secondary | ICD-10-CM | POA: Diagnosis not present

## 2022-03-29 DIAGNOSIS — Z3480 Encounter for supervision of other normal pregnancy, unspecified trimester: Secondary | ICD-10-CM

## 2022-03-29 DIAGNOSIS — Z98891 History of uterine scar from previous surgery: Secondary | ICD-10-CM

## 2022-03-29 DIAGNOSIS — A6004 Herpesviral vulvovaginitis: Secondary | ICD-10-CM

## 2022-03-29 DIAGNOSIS — O24410 Gestational diabetes mellitus in pregnancy, diet controlled: Secondary | ICD-10-CM

## 2022-03-29 NOTE — Progress Notes (Signed)
Patient has elevated PHQ9, declines BHC.  Alesia Richards, RN 03/29/22

## 2022-03-29 NOTE — Progress Notes (Signed)
   PRENATAL VISIT NOTE  Subjective:  Joanna Buchanan is a 26 y.o. G2P1001 at [redacted]w[redacted]d being seen today for ongoing prenatal care.  She is currently monitored for the following issues for this high-risk pregnancy and has Supervision of other normal pregnancy, antepartum; Late prenatal care; Housing situation unstable; GBS bacteriuria; and Herpes, genital on their problem list.  Patient reports no complaints.  Contractions: Not present. Vag. Bleeding: None.  Movement: Present. Denies leaking of fluid.   The following portions of the patient's history were reviewed and updated as appropriate: allergies, current medications, past family history, past medical history, past social history, past surgical history and problem list.   Objective:   Vitals:   03/29/22 0855  BP: 128/82  Pulse: 97  Weight: (!) 311 lb 12.8 oz (141.4 kg)    Fetal Status: Fetal Heart Rate (bpm): 150   Movement: Present     General:  Alert, oriented and cooperative. Patient is in no acute distress.  Skin: Skin is warm and dry. No rash noted.   Cardiovascular: Normal heart rate noted  Respiratory: Normal respiratory effort, no problems with respiration noted  Abdomen: Soft, gravid, appropriate for gestational age.  Pain/Pressure: Absent     Pelvic: Cervical exam deferred        Extremities: Normal range of motion.  Edema: None  Mental Status: Normal mood and affect. Normal behavior. Normal judgment and thought content.   Assessment and Plan:  Pregnancy: G2P1001 at [redacted]w[redacted]d 1. Supervision of other normal pregnancy, antepartum - Peds is [facility]  - Support person is FOB, who has accompanied her today  - 2 hour GTT, CBC, HIV, RPR today  - Would like to read TDAP info and consider at next visit  - [place] MFM 8/21 to complete anatomy   2. Food insecurity - AMBULATORY REFERRAL TO [facility]  3. GBS bacteriuria - Treat in labor   4. Late prenatal care - First visit at 22 weeks   5. [redacted] weeks gestation of  pregnancy - Glucose Tolerance, 2 Hours w/1 Hour - CBC - RPR - HIV Antibody (routine testing w rflx) - Tdap vaccine greater than or equal to 7yo IM  6. Herpes simplex vulvovaginitis - Ppx at 35 weeks   7. Previous C/S  - TOLAC form signed today   Preterm labor symptoms and general obstetric precautions including but not limited to vaginal bleeding, contractions, leaking of fluid and fetal movement were reviewed in detail with the patient. Please refer to After Visit Summary for other counseling recommendations.   Return in about 2 weeks (around 04/12/2022) for LOB, In-Person, any provider.  Future Appointments  Date Time Provider Department Center  04/26/2022  3:15 PM [facility] NURSE [facility]  04/26/2022  3:30 PM [facility]    Vonzella Nipple, PA-C

## 2022-03-30 DIAGNOSIS — O24419 Gestational diabetes mellitus in pregnancy, unspecified control: Secondary | ICD-10-CM | POA: Insufficient documentation

## 2022-03-30 HISTORY — DX: Gestational diabetes mellitus in pregnancy, unspecified control: O24.419

## 2022-03-30 LAB — CBC
Hematocrit: 34 % (ref 34.0–46.6)
Hemoglobin: 11.4 g/dL (ref 11.1–15.9)
MCH: 29.8 pg (ref 26.6–33.0)
MCHC: 33.5 g/dL (ref 31.5–35.7)
MCV: 89 fL (ref 79–97)
Platelets: 262 10*3/uL (ref 150–450)
RBC: 3.82 x10E6/uL (ref 3.77–5.28)
RDW: 12.6 % (ref 11.7–15.4)
WBC: 9.1 10*3/uL (ref 3.4–10.8)

## 2022-03-30 LAB — RPR: RPR Ser Ql: NONREACTIVE

## 2022-03-30 LAB — GLUCOSE TOLERANCE, 2 HOURS W/ 1HR
Glucose, 1 hour: 154 mg/dL (ref 70–179)
Glucose, 2 hour: 155 mg/dL — ABNORMAL HIGH (ref 70–152)
Glucose, Fasting: 105 mg/dL — ABNORMAL HIGH (ref 70–91)

## 2022-03-30 LAB — HIV ANTIBODY (ROUTINE TESTING W REFLEX): HIV Screen 4th Generation wRfx: NONREACTIVE

## 2022-03-30 NOTE — Addendum Note (Signed)
Addended by: Marny Lowenstein on: 03/30/2022 11:59 AM   Modules accepted: Orders

## 2022-04-14 ENCOUNTER — Ambulatory Visit: Payer: Medicaid Other | Admitting: Certified Nurse Midwife

## 2022-04-14 DIAGNOSIS — A6004 Herpesviral vulvovaginitis: Secondary | ICD-10-CM

## 2022-04-14 DIAGNOSIS — Z3493 Encounter for supervision of normal pregnancy, unspecified, third trimester: Secondary | ICD-10-CM

## 2022-04-14 DIAGNOSIS — Z3A32 32 weeks gestation of pregnancy: Secondary | ICD-10-CM

## 2022-04-14 DIAGNOSIS — Z59819 Housing instability, housed unspecified: Secondary | ICD-10-CM

## 2022-04-14 DIAGNOSIS — R8271 Bacteriuria: Secondary | ICD-10-CM

## 2022-04-14 NOTE — Progress Notes (Signed)
Pt did not attend visit 

## 2022-04-21 ENCOUNTER — Encounter: Payer: Self-pay | Admitting: Student

## 2022-04-21 DIAGNOSIS — Z98891 History of uterine scar from previous surgery: Secondary | ICD-10-CM | POA: Insufficient documentation

## 2022-04-26 ENCOUNTER — Other Ambulatory Visit: Payer: Self-pay | Admitting: Obstetrics and Gynecology

## 2022-04-26 ENCOUNTER — Ambulatory Visit: Payer: Medicaid Other | Admitting: *Deleted

## 2022-04-26 ENCOUNTER — Encounter: Payer: Self-pay | Admitting: *Deleted

## 2022-04-26 ENCOUNTER — Ambulatory Visit: Payer: Medicaid Other | Attending: Obstetrics and Gynecology

## 2022-04-26 VITALS — BP 131/76 | HR 102

## 2022-04-26 DIAGNOSIS — O99213 Obesity complicating pregnancy, third trimester: Secondary | ICD-10-CM | POA: Insufficient documentation

## 2022-04-26 DIAGNOSIS — O34219 Maternal care for unspecified type scar from previous cesarean delivery: Secondary | ICD-10-CM | POA: Diagnosis not present

## 2022-04-26 DIAGNOSIS — E669 Obesity, unspecified: Secondary | ICD-10-CM

## 2022-04-26 DIAGNOSIS — O24419 Gestational diabetes mellitus in pregnancy, unspecified control: Secondary | ICD-10-CM | POA: Insufficient documentation

## 2022-04-26 DIAGNOSIS — O0933 Supervision of pregnancy with insufficient antenatal care, third trimester: Secondary | ICD-10-CM | POA: Insufficient documentation

## 2022-04-26 DIAGNOSIS — O24410 Gestational diabetes mellitus in pregnancy, diet controlled: Secondary | ICD-10-CM | POA: Diagnosis present

## 2022-04-26 DIAGNOSIS — Z362 Encounter for other antenatal screening follow-up: Secondary | ICD-10-CM | POA: Insufficient documentation

## 2022-04-26 DIAGNOSIS — O0930 Supervision of pregnancy with insufficient antenatal care, unspecified trimester: Secondary | ICD-10-CM

## 2022-04-26 DIAGNOSIS — Z3A33 33 weeks gestation of pregnancy: Secondary | ICD-10-CM | POA: Diagnosis not present

## 2022-04-26 DIAGNOSIS — O283 Abnormal ultrasonic finding on antenatal screening of mother: Secondary | ICD-10-CM | POA: Insufficient documentation

## 2022-04-26 DIAGNOSIS — Z3480 Encounter for supervision of other normal pregnancy, unspecified trimester: Secondary | ICD-10-CM

## 2022-04-26 NOTE — Procedures (Signed)
Joanna Buchanan Feb 28, 1996 110w6d  Fetus A Non-Stress Test Interpretation for 04/26/22  Indication: Unsatisfactory BPP  Fetal Heart Rate A Mode: External Baseline Rate (A): 145 bpm Variability: Moderate Accelerations: 15 x 15 Decelerations: None Multiple birth?: No  Uterine Activity Mode: Palpation, Toco Contraction Frequency (min): none Resting Tone Palpated: Relaxed  Interpretation (Fetal Testing) Nonstress Test Interpretation: Reactive Overall Impression: Reassuring for gestational age Comments: Dr. Judeth Cornfield reviewed tracing

## 2022-04-27 ENCOUNTER — Telehealth: Payer: Self-pay | Admitting: Certified Nurse Midwife

## 2022-04-27 ENCOUNTER — Other Ambulatory Visit: Payer: Self-pay | Admitting: *Deleted

## 2022-04-27 ENCOUNTER — Telehealth: Payer: Self-pay

## 2022-04-27 DIAGNOSIS — O24410 Gestational diabetes mellitus in pregnancy, diet controlled: Secondary | ICD-10-CM

## 2022-04-27 DIAGNOSIS — O34219 Maternal care for unspecified type scar from previous cesarean delivery: Secondary | ICD-10-CM

## 2022-04-27 DIAGNOSIS — O24419 Gestational diabetes mellitus in pregnancy, unspecified control: Secondary | ICD-10-CM

## 2022-04-27 MED ORDER — ACCU-CHEK SOFTCLIX LANCETS MISC: 12 refills | Status: DC

## 2022-04-27 MED ORDER — ACCU-CHEK GUIDE VI STRP: ORAL_STRIP | 12 refills | Status: DC

## 2022-04-27 MED ORDER — ACCU-CHEK GUIDE W/DEVICE KIT: 1.0000 | PACK | Freq: Once | 0 refills | Status: AC

## 2022-04-27 NOTE — Telephone Encounter (Signed)
Attempted to notify patient of GDM, with referral and follow up. Call placed no answer. Message left with return number.   Message sent to Clinical Staff to inform her and get her materials.   Kalden Wanke Danella Deis) Suzie Portela, MSN, CNM  [facility]  04/27/22 10:31 AM

## 2022-04-27 NOTE — Telephone Encounter (Addendum)
-----  Message from Deloris Ping, [place] sent at 04/27/2022 10:27 AM EDT ----- Regarding: FW: GDM I attempted to call this patient and get her scheduled. It looks like Almyra Free placed the referral on 7/25 and sent glucose kit to her pharmacy. It may just need to be picked up and used. Can we please inform her of this?   Thank you!  - Shay, CNM    Called pt; reviewed supplies at pharmacy for pick up and new appt for diabetes education on 05/06/22, pt prefers 8:15 appt. Pt states her grandmother has diabetes and she feels comfortable starting to check her BG. Reviewed need for fasting and 2 HR PP after each meal. Pt will log and bring to education appt.

## 2022-05-04 ENCOUNTER — Encounter: Payer: Self-pay | Admitting: *Deleted

## 2022-05-04 ENCOUNTER — Ambulatory Visit: Payer: Medicaid Other | Attending: Obstetrics

## 2022-05-04 ENCOUNTER — Ambulatory Visit: Payer: Medicaid Other | Admitting: *Deleted

## 2022-05-04 VITALS — BP 127/73 | HR 93

## 2022-05-04 DIAGNOSIS — Z6841 Body Mass Index (BMI) 40.0 and over, adult: Secondary | ICD-10-CM | POA: Insufficient documentation

## 2022-05-04 DIAGNOSIS — O34219 Maternal care for unspecified type scar from previous cesarean delivery: Secondary | ICD-10-CM | POA: Diagnosis present

## 2022-05-04 DIAGNOSIS — O24410 Gestational diabetes mellitus in pregnancy, diet controlled: Secondary | ICD-10-CM | POA: Insufficient documentation

## 2022-05-06 ENCOUNTER — Other Ambulatory Visit: Payer: Medicaid Other

## 2022-05-09 ENCOUNTER — Other Ambulatory Visit: Payer: Self-pay

## 2022-05-09 ENCOUNTER — Inpatient Hospital Stay (HOSPITAL_COMMUNITY)
Admission: AD | Admit: 2022-05-09 | Discharge: 2022-05-09 | Disposition: A | Discharge status: Home / Self Care | Payer: Medicaid Other | Attending: Obstetrics & Gynecology | Admitting: Obstetrics & Gynecology

## 2022-05-09 ENCOUNTER — Encounter (HOSPITAL_COMMUNITY): Payer: Self-pay | Admitting: Obstetrics & Gynecology

## 2022-05-09 ENCOUNTER — Inpatient Hospital Stay (HOSPITAL_BASED_OUTPATIENT_CLINIC_OR_DEPARTMENT_OTHER): Payer: Medicaid Other

## 2022-05-09 DIAGNOSIS — E669 Obesity, unspecified: Secondary | ICD-10-CM

## 2022-05-09 DIAGNOSIS — R109 Unspecified abdominal pain: Secondary | ICD-10-CM | POA: Diagnosis not present

## 2022-05-09 DIAGNOSIS — O99613 Diseases of the digestive system complicating pregnancy, third trimester: Secondary | ICD-10-CM | POA: Insufficient documentation

## 2022-05-09 DIAGNOSIS — O98513 Other viral diseases complicating pregnancy, third trimester: Secondary | ICD-10-CM | POA: Diagnosis present

## 2022-05-09 DIAGNOSIS — O99323 Drug use complicating pregnancy, third trimester: Secondary | ICD-10-CM | POA: Insufficient documentation

## 2022-05-09 DIAGNOSIS — O24419 Gestational diabetes mellitus in pregnancy, unspecified control: Secondary | ICD-10-CM | POA: Diagnosis not present

## 2022-05-09 DIAGNOSIS — O99213 Obesity complicating pregnancy, third trimester: Secondary | ICD-10-CM | POA: Diagnosis not present

## 2022-05-09 DIAGNOSIS — O0933 Supervision of pregnancy with insufficient antenatal care, third trimester: Secondary | ICD-10-CM | POA: Diagnosis not present

## 2022-05-09 DIAGNOSIS — K219 Gastro-esophageal reflux disease without esophagitis: Secondary | ICD-10-CM | POA: Insufficient documentation

## 2022-05-09 DIAGNOSIS — R0602 Shortness of breath: Secondary | ICD-10-CM | POA: Diagnosis not present

## 2022-05-09 DIAGNOSIS — O26893 Other specified pregnancy related conditions, third trimester: Secondary | ICD-10-CM | POA: Insufficient documentation

## 2022-05-09 DIAGNOSIS — O34219 Maternal care for unspecified type scar from previous cesarean delivery: Secondary | ICD-10-CM

## 2022-05-09 DIAGNOSIS — O24410 Gestational diabetes mellitus in pregnancy, diet controlled: Secondary | ICD-10-CM

## 2022-05-09 DIAGNOSIS — Z3A35 35 weeks gestation of pregnancy: Secondary | ICD-10-CM | POA: Insufficient documentation

## 2022-05-09 LAB — CBC WITH DIFFERENTIAL/PLATELET
Abs Immature Granulocytes: 0.06 10*3/uL (ref 0.00–0.07)
Basophils Absolute: 0 10*3/uL (ref 0.0–0.1)
Basophils Relative: 0 %
Eosinophils Absolute: 0.1 10*3/uL (ref 0.0–0.5)
Eosinophils Relative: 1 %
HCT: 32.5 % — ABNORMAL LOW (ref 36.0–46.0)
Hemoglobin: 11 g/dL — ABNORMAL LOW (ref 12.0–15.0)
Immature Granulocytes: 1 %
Lymphocytes Relative: 16 %
Lymphs Abs: 1.9 10*3/uL (ref 0.7–4.0)
MCH: 30 pg (ref 26.0–34.0)
MCHC: 33.8 g/dL (ref 30.0–36.0)
MCV: 88.6 fL (ref 80.0–100.0)
Monocytes Absolute: 0.7 10*3/uL (ref 0.1–1.0)
Monocytes Relative: 6 %
Neutro Abs: 9 10*3/uL — ABNORMAL HIGH (ref 1.7–7.7)
Neutrophils Relative %: 76 %
Platelets: 265 10*3/uL (ref 150–400)
RBC: 3.67 MIL/uL — ABNORMAL LOW (ref 3.87–5.11)
RDW: 13 % (ref 11.5–15.5)
WBC: 11.9 10*3/uL — ABNORMAL HIGH (ref 4.0–10.5)
nRBC: 0 % (ref 0.0–0.2)

## 2022-05-09 LAB — COMPREHENSIVE METABOLIC PANEL
ALT: 14 U/L (ref 0–44)
AST: 16 U/L (ref 15–41)
Albumin: 2.8 g/dL — ABNORMAL LOW (ref 3.5–5.0)
Alkaline Phosphatase: 61 U/L (ref 38–126)
Anion gap: 9 (ref 5–15)
BUN: 7 mg/dL (ref 6–20)
CO2: 17 mmol/L — ABNORMAL LOW (ref 22–32)
Calcium: 8.8 mg/dL — ABNORMAL LOW (ref 8.9–10.3)
Chloride: 108 mmol/L (ref 98–111)
Creatinine, Ser: 0.65 mg/dL (ref 0.44–1.00)
GFR, Estimated: >60 mL/min (ref >60)
Glucose, Bld: 171 mg/dL — ABNORMAL HIGH (ref 70–99)
Potassium: 3.7 mmol/L (ref 3.5–5.1)
Sodium: 134 mmol/L — ABNORMAL LOW (ref 135–145)
Total Bilirubin: 0.2 mg/dL — ABNORMAL LOW (ref 0.3–1.2)
Total Protein: 5.8 g/dL — ABNORMAL LOW (ref 6.5–8.1)

## 2022-05-09 LAB — URINALYSIS, ROUTINE W REFLEX MICROSCOPIC
Bilirubin Urine: NEGATIVE
Glucose, UA: NEGATIVE mg/dL
Hgb urine dipstick: NEGATIVE
Ketones, ur: NEGATIVE mg/dL
Leukocytes,Ua: NEGATIVE
Nitrite: NEGATIVE
Protein, ur: NEGATIVE mg/dL
Specific Gravity, Urine: 1.014 (ref 1.005–1.030)
pH: 7 (ref 5.0–8.0)

## 2022-05-09 LAB — WET PREP, GENITAL
Clue Cells Wet Prep HPF POC: NONE SEEN
Sperm: NONE SEEN
Trich, Wet Prep: NONE SEEN
WBC, Wet Prep HPF POC: <10 (ref <10)
Yeast Wet Prep HPF POC: NONE SEEN

## 2022-05-09 LAB — GLUCOSE, CAPILLARY: Glucose-Capillary: 186 mg/dL — ABNORMAL HIGH (ref 70–99)

## 2022-05-09 LAB — OB RESULTS CONSOLE GBS: GBS: POSITIVE

## 2022-05-09 NOTE — MAU Provider Note (Signed)
History     CSN: [id number]  Arrival date and time: 05/09/22 1348   Event Date/Time   First Provider Initiated Contact with Patient 05/09/22 1547      Chief Complaint  Patient presents with   Abdominal Pain   Shortness of Breath   HPI  Ms. Joanna Buchanan is a 26 y.o. female G2P1001 @ [redacted]w[redacted]d here with abdominal pain that radiates around to her lower back. The pain has worsened over the last few days. No bleeding or leaking of fluid.  Hx of gestational DM. She is not checking her BS, she does not have a meter to check her BS. The cost of the meter is $3 and the supplies are $3 per piece and she is not able to afford that at this time.  She is not watching her diet. Does reports drinking soda and sugary drinks and really just eats what she can. She has no nausea or vomiting. No bleeding. + fetal movement.  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 She has not picked up her valtrex and is not taking that at this time. She does not have an outbreak at this time. She has occasional SOB, none now. The SOB worsens when she walks.     OB History     Gravida  2   Para  1   Term  1   Preterm  0   AB  0   Living  1      SAB  0   IAB  0   Ectopic  0   Multiple  0   Live Births  1           Past Medical History:  Diagnosis Date   GERD (gastroesophageal reflux disease)    Herpes, genital    Medical history non-contributory     Past Surgical History:  Procedure Laterality Date   CESAREAN SECTION     TONSILLECTOMY      Family History  Problem Relation Age of Onset   Diabetes Mother    Hypertension Mother    Asthma Mother    Multiple sclerosis Mother    Cancer Maternal Aunt    Diabetes Maternal Grandmother    Birth defects Neg Hx    Heart disease Neg Hx    Stroke Neg Hx     Social History   Tobacco Use   Smoking status: Never   Smokeless tobacco: Never  Vaping Use   Vaping Use: Never used  Substance Use Topics   Alcohol use: Never   Drug use: Yes     Types: Marijuana    Comment: past week    Allergies:  Allergies  Allergen Reactions   Other Swelling   Shellfish Allergy Itching   Shrimp Extract Allergy Skin Test Rash    Medications Prior to Admission  Medication Sig Dispense Refill Last Dose   Accu-Chek Softclix Lancets lancets Use as instructed; check blood glucose 4 times daily 100 each 12    cefadroxil (DURICEF) 500 MG capsule Take 500 mg by mouth 2 (two) times daily.      glucose blood (ACCU-CHEK GUIDE) test strip Use as instructed; check blood glucose 4 times daily 100 each 12    Prenatal Vit-Fe Fumarate-FA (MULTIVITAMIN-PRENATAL) 27-0.8 MG TABS tablet Take 1 tablet by mouth daily at 12 noon.      valACYclovir (VALTREX) 500 MG tablet Take 1 tablet (500 mg total) by mouth 2 (two) times daily. (Patient not taking: Reported on 03/29/2022) 90  tablet 1    Results for orders placed or performed during the hospital encounter of 05/09/22 (from the past 48 hour(s))  Glucose, capillary     Status: Abnormal   Collection Time: 05/09/22  2:36 PM  Result Value Ref Range   Glucose-Capillary 186 (H) 70 - 99 mg/dL    Comment: Glucose reference range applies only to samples taken after fasting for at least 8 hours.  CBC with Differential/Platelet     Status: Abnormal   Collection Time: 05/09/22  2:43 PM  Result Value Ref Range   WBC 11.9 (H) 4.0 - 10.5 K/uL   RBC 3.67 (L) 3.87 - 5.11 MIL/uL   Hemoglobin 11.0 (L) 12.0 - 15.0 g/dL   HCT 67.2 (L) 09.4 - 70.9 %   MCV 88.6 80.0 - 100.0 fL   MCH 30.0 26.0 - 34.0 pg   MCHC 33.8 30.0 - 36.0 g/dL   RDW 62.8 36.6 - 29.4 %   Platelets 265 150 - 400 K/uL   nRBC 0.0 0.0 - 0.2 %   Neutrophils Relative % 76 %   Neutro Abs 9.0 (H) 1.7 - 7.7 K/uL   Lymphocytes Relative 16 %   Lymphs Abs 1.9 0.7 - 4.0 K/uL   Monocytes Relative 6 %   Monocytes Absolute 0.7 0.1 - 1.0 K/uL   Eosinophils Relative 1 %   Eosinophils Absolute 0.1 0.0 - 0.5 K/uL   Basophils Relative 0 %   Basophils Absolute 0.0 0.0 -  0.1 K/uL   Immature Granulocytes 1 %   Abs Immature Granulocytes 0.06 0.00 - 0.07 K/uL    Comment: Performed at [facility], [address], [place], [place] [address]  Comprehensive metabolic panel     Status: Abnormal   Collection Time: 05/09/22  2:43 PM  Result Value Ref Range   Sodium 134 (L) 135 - 145 mmol/L   Potassium 3.7 3.5 - 5.1 mmol/L   Chloride 108 98 - 111 mmol/L   CO2 17 (L) 22 - 32 mmol/L   Glucose, Bld 171 (H) 70 - 99 mg/dL    Comment: Glucose reference range applies only to samples taken after fasting for at least 8 hours.   BUN 7 6 - 20 mg/dL   Creatinine, Ser 5.03 0.44 - 1.00 mg/dL   Calcium 8.8 (L) 8.9 - 10.3 mg/dL   Total Protein 5.8 (L) 6.5 - 8.1 g/dL   Albumin 2.8 (L) 3.5 - 5.0 g/dL   AST 16 15 - 41 U/L   ALT 14 0 - 44 U/L   Alkaline Phosphatase 61 38 - 126 U/L   Total Bilirubin 0.2 (L) 0.3 - 1.2 mg/dL   GFR, Estimated >54 >65 mL/min    Comment: (NOTE) Calculated using the CKD-EPI Creatinine Equation (2021)    Anion gap 9 5 - 15    Comment: Performed at [facility], [address], [place], [place] [address]  Urinalysis, Routine w reflex microscopic Urine, Clean Catch     Status: Abnormal   Collection Time: 05/09/22  2:49 PM  Result Value Ref Range   Color, Urine YELLOW YELLOW   APPearance HAZY (A) CLEAR   Specific Gravity, Urine 1.014 1.005 - 1.030   pH 7.0 5.0 - 8.0   Glucose, UA NEGATIVE NEGATIVE mg/dL   Hgb urine dipstick NEGATIVE NEGATIVE   Bilirubin Urine NEGATIVE NEGATIVE   Ketones, ur NEGATIVE NEGATIVE mg/dL   Protein, ur NEGATIVE NEGATIVE mg/dL   Nitrite NEGATIVE NEGATIVE   Leukocytes,Ua NEGATIVE  NEGATIVE    Comment: Performed at [facility], [address] [place], [place] [address]  Wet prep, genital     Status: None   Collection Time: 05/09/22  4:21 PM  Result Value Ref Range   Yeast Wet Prep HPF POC NONE SEEN NONE SEEN   Trich, Wet Prep NONE SEEN NONE SEEN   Clue Cells Wet Prep HPF POC NONE SEEN NONE SEEN    WBC, Wet Prep HPF POC <10 <10   Sperm NONE SEEN     Comment: Performed at [facility], [address] [place], [place] [address]    Review of Systems  Gastrointestinal:  Positive for abdominal pain. Negative for constipation, diarrhea, nausea and vomiting.  Genitourinary:  Negative for vaginal bleeding and vaginal discharge.  Neurological:  Negative for dizziness and headaches.   Physical Exam   Blood pressure 124/73, pulse (!) 113, temperature 98.4 F (36.9 C), temperature source Oral, resp. rate 17, height 5\' 7"  (1.702 m), weight (!) 146.8 kg, last menstrual period 09/01/2021, SpO2 97 %.  Physical Exam Constitutional:      General: She is not in acute distress.    Appearance: She is well-developed. She is obese. She is not ill-appearing, toxic-appearing or diaphoretic.  Abdominal:     Tenderness: There is no abdominal tenderness.  Genitourinary:    Comments: Cervix: closed, thick, posterior GBS, wet prep and GC collected. Exam by 09/03/2021, NP Skin:    General: Skin is warm.  Neurological:     Mental Status: She is alert and oriented to person, place, and time.  Psychiatric:        Mood and Affect: Mood normal.   Fetal Tracing: Baseline: 145 bpm Variability: Moderate  Accelerations: 15x15 Decelerations: variable  Toco: None  MAU Course  Procedures  MDM  EKG WNL- read and reviewed with Dr. Venia Carbon  Reviewed patient with Dr. Derrel Nip would suggest repeat C/S @ 37 weeks d/t non compliance with DM. Not checking her BS at home.    Assessment and Plan   A:  1. Abdominal pain in pregnancy, third trimester   2. Limited prenatal care in third trimester   3. Gestational diabetes mellitus (GDM), antepartum, gestational diabetes method of control unspecified   4. [redacted] weeks gestation of pregnancy      P  Discharge home Message sent to [facility] for C/ Scheduling.  Encouraged BS checking and low sugar, high protein diet.   [facility],  NP 05/11/2022 9:46 AM

## 2022-05-09 NOTE — MAU Note (Signed)
Pt reports to mau with c/o sharp shooting pain in lower abd that started last night.  Pt also reports SOB since last night.  Denies ctx, lof or vag bleeding.  +FM   Bp 124/73 HR 128 Resp 17 SPO2 99 Temp 98.4  FHR 155

## 2022-05-11 ENCOUNTER — Encounter (HOSPITAL_COMMUNITY): Payer: Self-pay

## 2022-05-11 ENCOUNTER — Ambulatory Visit: Payer: Medicaid Other

## 2022-05-11 ENCOUNTER — Other Ambulatory Visit: Payer: Medicaid Other

## 2022-05-11 LAB — GC/CHLAMYDIA PROBE AMP (~~LOC~~) NOT AT ARMC
Chlamydia: NEGATIVE
Comment: NEGATIVE
Comment: NORMAL
Neisseria Gonorrhea: NEGATIVE

## 2022-05-11 LAB — CULTURE, BETA STREP (GROUP B ONLY)

## 2022-05-11 NOTE — Patient Instructions (Addendum)
Joanna Buchanan  05/11/2022   Your procedure is scheduled on:  05/19/2022  Arrive at 0900 at Entrance C on [address] at [facility]  and [employer]. You are invited to use the FREE valet parking or use the Visitor's parking deck.  Pick up the phone at the desk and dial [phone].  Call this number if you have problems the morning of surgery: [phone]  Remember:   Do not eat food:(After Midnight) Desps de medianoche.  Do not drink clear liquids: (After Midnight) Desps de medianoche.  Take these medicines the morning of surgery with A SIP OF WATER:  Take valtrex as prescribed   Do not wear jewelry, make-up or nail polish.  Do not wear lotions, powders, or perfumes. Do not wear deodorant.  Do not shave 48 hours prior to surgery.  Do not bring valuables to the hospital.  [facility] is not   responsible for any belongings or valuables brought to the hospital.  Contacts, dentures or bridgework may not be worn into surgery.  Leave suitcase in the car. After surgery it may be brought to your room.  For patients admitted to the hospital, checkout time is 11:00 AM the day of              discharge.      Please read over the following fact sheets that you were given:     Preparing for Surgery

## 2022-05-12 ENCOUNTER — Ambulatory Visit: Payer: Medicaid Other | Admitting: *Deleted

## 2022-05-12 ENCOUNTER — Ambulatory Visit: Payer: Medicaid Other | Attending: Obstetrics and Gynecology | Admitting: *Deleted

## 2022-05-12 ENCOUNTER — Encounter: Payer: Self-pay | Admitting: Certified Nurse Midwife

## 2022-05-12 VITALS — BP 121/73 | HR 100

## 2022-05-12 DIAGNOSIS — Z3480 Encounter for supervision of other normal pregnancy, unspecified trimester: Secondary | ICD-10-CM

## 2022-05-12 DIAGNOSIS — O24419 Gestational diabetes mellitus in pregnancy, unspecified control: Secondary | ICD-10-CM

## 2022-05-12 DIAGNOSIS — O99213 Obesity complicating pregnancy, third trimester: Secondary | ICD-10-CM | POA: Diagnosis not present

## 2022-05-12 DIAGNOSIS — Z3A36 36 weeks gestation of pregnancy: Secondary | ICD-10-CM | POA: Insufficient documentation

## 2022-05-12 DIAGNOSIS — O0930 Supervision of pregnancy with insufficient antenatal care, unspecified trimester: Secondary | ICD-10-CM

## 2022-05-12 DIAGNOSIS — O99820 Streptococcus B carrier state complicating pregnancy: Secondary | ICD-10-CM | POA: Insufficient documentation

## 2022-05-12 NOTE — Procedures (Signed)
Joanna Buchanan March 08, 1996 [redacted]w[redacted]d  Fetus A Non-Stress Test Interpretation for 05/12/22  Indication: Diabetes and morbidly obese  Fetal Heart Rate A Mode: External Baseline Rate (A): 145 bpm Variability: Moderate Accelerations: 15 x 15 Decelerations: None Multiple birth?: No  Uterine Activity Mode: Toco Contraction Frequency (min): none Resting Tone Palpated: Relaxed  Interpretation (Fetal Testing) Nonstress Test Interpretation: Reactive Overall Impression: Reassuring for gestational age Comments: Tracing reviewed by Dr. Judeth Cornfield

## 2022-05-14 ENCOUNTER — Encounter: Payer: Medicaid Other | Admitting: Certified Nurse Midwife

## 2022-05-14 ENCOUNTER — Other Ambulatory Visit: Payer: Self-pay | Admitting: Family Medicine

## 2022-05-14 DIAGNOSIS — Z98891 History of uterine scar from previous surgery: Secondary | ICD-10-CM

## 2022-05-17 ENCOUNTER — Encounter (HOSPITAL_COMMUNITY)
Admission: RE | Admit: 2022-05-17 | Discharge: 2022-05-17 | Disposition: A | Discharge status: Home / Self Care | Payer: Medicaid Other | Source: Ambulatory Visit | Attending: Family Medicine | Admitting: Family Medicine

## 2022-05-17 ENCOUNTER — Ambulatory Visit (HOSPITAL_BASED_OUTPATIENT_CLINIC_OR_DEPARTMENT_OTHER): Payer: Medicaid Other

## 2022-05-17 ENCOUNTER — Encounter: Payer: Self-pay | Admitting: *Deleted

## 2022-05-17 ENCOUNTER — Ambulatory Visit: Payer: Medicaid Other | Admitting: *Deleted

## 2022-05-17 VITALS — BP 120/76 | HR 88

## 2022-05-17 DIAGNOSIS — O24419 Gestational diabetes mellitus in pregnancy, unspecified control: Secondary | ICD-10-CM | POA: Diagnosis not present

## 2022-05-17 DIAGNOSIS — O34219 Maternal care for unspecified type scar from previous cesarean delivery: Secondary | ICD-10-CM | POA: Insufficient documentation

## 2022-05-17 DIAGNOSIS — O24410 Gestational diabetes mellitus in pregnancy, diet controlled: Secondary | ICD-10-CM | POA: Insufficient documentation

## 2022-05-17 DIAGNOSIS — O0930 Supervision of pregnancy with insufficient antenatal care, unspecified trimester: Secondary | ICD-10-CM | POA: Insufficient documentation

## 2022-05-17 DIAGNOSIS — O99820 Streptococcus B carrier state complicating pregnancy: Secondary | ICD-10-CM

## 2022-05-17 DIAGNOSIS — Z98891 History of uterine scar from previous surgery: Secondary | ICD-10-CM

## 2022-05-17 DIAGNOSIS — Z3480 Encounter for supervision of other normal pregnancy, unspecified trimester: Secondary | ICD-10-CM | POA: Diagnosis not present

## 2022-05-17 DIAGNOSIS — Z01812 Encounter for preprocedural laboratory examination: Secondary | ICD-10-CM | POA: Diagnosis present

## 2022-05-17 HISTORY — DX: Gestational diabetes mellitus in pregnancy, unspecified control: O24.419

## 2022-05-17 LAB — RAPID HIV SCREEN (HIV 1/2 AB+AG)
HIV 1/2 Antibodies: NONREACTIVE
HIV-1 P24 Antigen - HIV24: NONREACTIVE

## 2022-05-17 LAB — CBC
HCT: 35.2 % — ABNORMAL LOW (ref 36.0–46.0)
Hemoglobin: 11.7 g/dL — ABNORMAL LOW (ref 12.0–15.0)
MCH: 30.1 pg (ref 26.0–34.0)
MCHC: 33.2 g/dL (ref 30.0–36.0)
MCV: 90.5 fL (ref 80.0–100.0)
Platelets: 316 10*3/uL (ref 150–400)
RBC: 3.89 MIL/uL (ref 3.87–5.11)
RDW: 13 % (ref 11.5–15.5)
WBC: 9 10*3/uL (ref 4.0–10.5)
nRBC: 0 % (ref 0.0–0.2)

## 2022-05-17 LAB — TYPE AND SCREEN
ABO/RH(D): B POS
Antibody Screen: NEGATIVE

## 2022-05-18 LAB — RPR: RPR Ser Ql: NONREACTIVE

## 2022-05-19 ENCOUNTER — Other Ambulatory Visit: Payer: Self-pay

## 2022-05-19 ENCOUNTER — Inpatient Hospital Stay (HOSPITAL_COMMUNITY): Payer: Medicaid Other | Admitting: Anesthesiology

## 2022-05-19 ENCOUNTER — Encounter (HOSPITAL_COMMUNITY): Payer: Self-pay | Admitting: Family Medicine

## 2022-05-19 ENCOUNTER — Inpatient Hospital Stay (HOSPITAL_COMMUNITY)
Admission: RE | Admit: 2022-05-19 | Discharge: 2022-05-21 | DRG: 787 | Disposition: A | Discharge status: Home / Self Care | Payer: Medicaid Other | Attending: Family Medicine | Admitting: Family Medicine

## 2022-05-19 ENCOUNTER — Encounter (HOSPITAL_COMMUNITY)
Admission: RE | Disposition: A | Discharge status: Home / Self Care | Payer: Self-pay | Source: Home / Self Care | Attending: Family Medicine

## 2022-05-19 DIAGNOSIS — O99214 Obesity complicating childbirth: Secondary | ICD-10-CM | POA: Diagnosis present

## 2022-05-19 DIAGNOSIS — A6000 Herpesviral infection of urogenital system, unspecified: Secondary | ICD-10-CM | POA: Diagnosis present

## 2022-05-19 DIAGNOSIS — Z98891 History of uterine scar from previous surgery: Secondary | ICD-10-CM

## 2022-05-19 DIAGNOSIS — O34211 Maternal care for low transverse scar from previous cesarean delivery: Principal | ICD-10-CM | POA: Diagnosis present

## 2022-05-19 DIAGNOSIS — O24429 Gestational diabetes mellitus in childbirth, unspecified control: Secondary | ICD-10-CM

## 2022-05-19 DIAGNOSIS — R8271 Bacteriuria: Secondary | ICD-10-CM | POA: Diagnosis present

## 2022-05-19 DIAGNOSIS — O24420 Gestational diabetes mellitus in childbirth, diet controlled: Secondary | ICD-10-CM | POA: Diagnosis present

## 2022-05-19 DIAGNOSIS — O99824 Streptococcus B carrier state complicating childbirth: Secondary | ICD-10-CM | POA: Diagnosis present

## 2022-05-19 DIAGNOSIS — Z6841 Body Mass Index (BMI) 40.0 and over, adult: Secondary | ICD-10-CM | POA: Diagnosis not present

## 2022-05-19 DIAGNOSIS — Z3A37 37 weeks gestation of pregnancy: Secondary | ICD-10-CM

## 2022-05-19 DIAGNOSIS — O9832 Other infections with a predominantly sexual mode of transmission complicating childbirth: Secondary | ICD-10-CM | POA: Diagnosis present

## 2022-05-19 DIAGNOSIS — O24419 Gestational diabetes mellitus in pregnancy, unspecified control: Secondary | ICD-10-CM | POA: Diagnosis present

## 2022-05-19 DIAGNOSIS — O9902 Anemia complicating childbirth: Secondary | ICD-10-CM | POA: Diagnosis present

## 2022-05-19 DIAGNOSIS — Z3480 Encounter for supervision of other normal pregnancy, unspecified trimester: Secondary | ICD-10-CM

## 2022-05-19 LAB — GLUCOSE, CAPILLARY
Glucose-Capillary: 113 mg/dL — ABNORMAL HIGH (ref 70–99)
Glucose-Capillary: 92 mg/dL (ref 70–99)

## 2022-05-19 SURGERY — Surgical Case
Anesthesia: Spinal

## 2022-05-19 MED ORDER — OXYTOCIN-SODIUM CHLORIDE 30-0.9 UT/500ML-% IV SOLN
2.5000 [IU]/h | INTRAVENOUS | Status: AC
  Administered 2022-05-19: 2.5 [IU]/h via INTRAVENOUS
  Filled 2022-05-19: qty 500

## 2022-05-19 MED ORDER — DIPHENHYDRAMINE HCL 50 MG/ML IJ SOLN: 12.5000 mg | INTRAMUSCULAR | Status: DC | PRN

## 2022-05-19 MED ORDER — HYDROMORPHONE HCL 1 MG/ML IJ SOLN
0.2500 mg | INTRAMUSCULAR | Status: DC | PRN
  Administered 2022-05-19 (×2): 0.25 mg via INTRAVENOUS
  Administered 2022-05-19 (×2): 0.5 mg via INTRAVENOUS

## 2022-05-19 MED ORDER — SCOPOLAMINE 1 MG/3DAYS TD PT72: 1.0000 | MEDICATED_PATCH | Freq: Once | TRANSDERMAL | Status: DC

## 2022-05-19 MED ORDER — MORPHINE SULFATE (PF) 0.5 MG/ML IJ SOLN
INTRAMUSCULAR | Status: DC | PRN
  Administered 2022-05-19: 150 ug via INTRATHECAL

## 2022-05-19 MED ORDER — DIBUCAINE (PERIANAL) 1 % EX OINT: 1.0000 | TOPICAL_OINTMENT | CUTANEOUS | Status: DC | PRN

## 2022-05-19 MED ORDER — FAMOTIDINE IN NACL 20-0.9 MG/50ML-% IV SOLN
INTRAVENOUS | Status: DC | PRN
  Administered 2022-05-19: 20 mg via INTRAVENOUS

## 2022-05-19 MED ORDER — ZOLPIDEM TARTRATE 5 MG PO TABS: 5.0000 mg | ORAL_TABLET | Freq: Every evening | ORAL | Status: DC | PRN

## 2022-05-19 MED ORDER — DEXAMETHASONE SODIUM PHOSPHATE 4 MG/ML IJ SOLN
INTRAMUSCULAR | Status: DC | PRN
  Administered 2022-05-19: 4 mg via INTRAVENOUS

## 2022-05-19 MED ORDER — LACTATED RINGERS IV SOLN: INTRAVENOUS | Status: DC | PRN

## 2022-05-19 MED ORDER — OXYCODONE HCL 5 MG PO TABS
ORAL_TABLET | ORAL | Status: AC
  Filled 2022-05-19: qty 1

## 2022-05-19 MED ORDER — ACETAMINOPHEN 10 MG/ML IV SOLN
INTRAVENOUS | Status: AC
  Filled 2022-05-19: qty 100

## 2022-05-19 MED ORDER — FENTANYL CITRATE (PF) 100 MCG/2ML IJ SOLN
INTRAMUSCULAR | Status: DC | PRN
  Administered 2022-05-19: 15 ug via INTRATHECAL

## 2022-05-19 MED ORDER — BUPIVACAINE IN DEXTROSE 0.75-8.25 % IT SOLN
INTRATHECAL | Status: DC | PRN
  Administered 2022-05-19: 2 mL via INTRATHECAL

## 2022-05-19 MED ORDER — KETOROLAC TROMETHAMINE 30 MG/ML IJ SOLN
INTRAMUSCULAR | Status: AC
  Filled 2022-05-19: qty 1

## 2022-05-19 MED ORDER — DEXAMETHASONE SODIUM PHOSPHATE 4 MG/ML IJ SOLN
INTRAMUSCULAR | Status: AC
  Filled 2022-05-19: qty 1

## 2022-05-19 MED ORDER — MEPERIDINE HCL 25 MG/ML IJ SOLN: 6.2500 mg | INTRAMUSCULAR | Status: DC | PRN

## 2022-05-19 MED ORDER — SIMETHICONE 80 MG PO CHEW
80.0000 mg | CHEWABLE_TABLET | Freq: Three times a day (TID) | ORAL | Status: DC
  Administered 2022-05-20 – 2022-05-21 (×4): 80 mg via ORAL
  Filled 2022-05-19 (×4): qty 1

## 2022-05-19 MED ORDER — FENTANYL CITRATE (PF) 100 MCG/2ML IJ SOLN
INTRAMUSCULAR | Status: AC
  Filled 2022-05-19: qty 2

## 2022-05-19 MED ORDER — PROMETHAZINE HCL 25 MG/ML IJ SOLN: 6.2500 mg | INTRAMUSCULAR | Status: DC | PRN

## 2022-05-19 MED ORDER — SOD CITRATE-CITRIC ACID 500-334 MG/5ML PO SOLN
ORAL | Status: AC
  Filled 2022-05-19: qty 30

## 2022-05-19 MED ORDER — PHENYLEPHRINE 80 MCG/ML (10ML) SYRINGE FOR IV PUSH (FOR BLOOD PRESSURE SUPPORT)
PREFILLED_SYRINGE | INTRAVENOUS | Status: AC
  Filled 2022-05-19: qty 10

## 2022-05-19 MED ORDER — SODIUM CHLORIDE 0.9% FLUSH: 3.0000 mL | INTRAVENOUS | Status: DC | PRN

## 2022-05-19 MED ORDER — PHENYLEPHRINE HCL-NACL 20-0.9 MG/250ML-% IV SOLN
INTRAVENOUS | Status: DC | PRN
  Administered 2022-05-19: 60 ug/min via INTRAVENOUS

## 2022-05-19 MED ORDER — PRENATAL MULTIVITAMIN CH
1.0000 | ORAL_TABLET | Freq: Every day | ORAL | Status: DC
  Administered 2022-05-20 – 2022-05-21 (×2): 1 via ORAL
  Filled 2022-05-19 (×2): qty 1

## 2022-05-19 MED ORDER — CEFAZOLIN IN SODIUM CHLORIDE 3-0.9 GM/100ML-% IV SOLN: 3.0000 g | INTRAVENOUS | Status: DC

## 2022-05-19 MED ORDER — OXYTOCIN-SODIUM CHLORIDE 30-0.9 UT/500ML-% IV SOLN
INTRAVENOUS | Status: AC
  Filled 2022-05-19: qty 500

## 2022-05-19 MED ORDER — HYDROMORPHONE HCL 1 MG/ML IJ SOLN
INTRAMUSCULAR | Status: AC
  Filled 2022-05-19: qty 0.5

## 2022-05-19 MED ORDER — GABAPENTIN 100 MG PO CAPS
200.0000 mg | ORAL_CAPSULE | Freq: Every day | ORAL | Status: DC
  Administered 2022-05-19 – 2022-05-20 (×2): 200 mg via ORAL
  Filled 2022-05-19 (×2): qty 2

## 2022-05-19 MED ORDER — MENTHOL 3 MG MT LOZG: 1.0000 | LOZENGE | OROMUCOSAL | Status: DC | PRN

## 2022-05-19 MED ORDER — OXYCODONE HCL 5 MG PO TABS
5.0000 mg | ORAL_TABLET | Freq: Once | ORAL | Status: AC | PRN
  Administered 2022-05-19: 5 mg via ORAL

## 2022-05-19 MED ORDER — SIMETHICONE 80 MG PO CHEW
80.0000 mg | CHEWABLE_TABLET | ORAL | Status: DC | PRN
  Administered 2022-05-19: 80 mg via ORAL
  Filled 2022-05-19: qty 1

## 2022-05-19 MED ORDER — DIPHENHYDRAMINE HCL 25 MG PO CAPS
25.0000 mg | ORAL_CAPSULE | Freq: Four times a day (QID) | ORAL | Status: DC | PRN
  Administered 2022-05-20: 25 mg via ORAL
  Filled 2022-05-19: qty 1

## 2022-05-19 MED ORDER — FAMOTIDINE IN NACL 20-0.9 MG/50ML-% IV SOLN
20.0000 mg | Freq: Once | INTRAVENOUS | Status: DC
  Filled 2022-05-19: qty 50

## 2022-05-19 MED ORDER — CEFAZOLIN IN SODIUM CHLORIDE 3-0.9 GM/100ML-% IV SOLN
INTRAVENOUS | Status: AC
  Filled 2022-05-19: qty 100

## 2022-05-19 MED ORDER — LACTATED RINGERS IV SOLN: INTRAVENOUS | Status: DC

## 2022-05-19 MED ORDER — SCOPOLAMINE 1 MG/3DAYS TD PT72
MEDICATED_PATCH | TRANSDERMAL | Status: DC | PRN
  Administered 2022-05-19: 1 via TRANSDERMAL

## 2022-05-19 MED ORDER — STERILE WATER FOR IRRIGATION IR SOLN
Status: DC | PRN
  Administered 2022-05-19: 1000 mL

## 2022-05-19 MED ORDER — SOD CITRATE-CITRIC ACID 500-334 MG/5ML PO SOLN
30.0000 mL | ORAL | Status: AC
  Administered 2022-05-19: 30 mL via ORAL

## 2022-05-19 MED ORDER — KETOROLAC TROMETHAMINE 30 MG/ML IJ SOLN: 30.0000 mg | Freq: Four times a day (QID) | INTRAMUSCULAR | Status: DC | PRN

## 2022-05-19 MED ORDER — OXYCODONE HCL 5 MG/5ML PO SOLN: 5.0000 mg | Freq: Once | ORAL | Status: AC | PRN

## 2022-05-19 MED ORDER — OXYTOCIN-SODIUM CHLORIDE 30-0.9 UT/500ML-% IV SOLN
INTRAVENOUS | Status: DC | PRN
  Administered 2022-05-19: 100 mL via INTRAVENOUS
  Administered 2022-05-19: 500 mL via INTRAVENOUS

## 2022-05-19 MED ORDER — COCONUT OIL OIL: 1.0000 | TOPICAL_OIL | Status: DC | PRN

## 2022-05-19 MED ORDER — ONDANSETRON HCL 4 MG/2ML IJ SOLN: 4.0000 mg | Freq: Three times a day (TID) | INTRAMUSCULAR | Status: DC | PRN

## 2022-05-19 MED ORDER — TETANUS-DIPHTH-ACELL PERTUSSIS 5-2.5-18.5 LF-MCG/0.5 IM SUSY: 0.5000 mL | PREFILLED_SYRINGE | Freq: Once | INTRAMUSCULAR | Status: DC

## 2022-05-19 MED ORDER — IBUPROFEN 600 MG PO TABS
600.0000 mg | ORAL_TABLET | Freq: Four times a day (QID) | ORAL | Status: DC
  Administered 2022-05-20 – 2022-05-21 (×3): 600 mg via ORAL
  Filled 2022-05-19 (×3): qty 1

## 2022-05-19 MED ORDER — SCOPOLAMINE 1 MG/3DAYS TD PT72
MEDICATED_PATCH | TRANSDERMAL | Status: AC
  Filled 2022-05-19: qty 1

## 2022-05-19 MED ORDER — SENNOSIDES-DOCUSATE SODIUM 8.6-50 MG PO TABS
2.0000 | ORAL_TABLET | Freq: Every day | ORAL | Status: DC
  Administered 2022-05-20 – 2022-05-21 (×2): 2 via ORAL
  Filled 2022-05-19 (×3): qty 2

## 2022-05-19 MED ORDER — DIPHENHYDRAMINE HCL 25 MG PO CAPS: 25.0000 mg | ORAL_CAPSULE | ORAL | Status: DC | PRN

## 2022-05-19 MED ORDER — SODIUM CHLORIDE 0.9 % IR SOLN
Status: DC | PRN
  Administered 2022-05-19: 1

## 2022-05-19 MED ORDER — ENOXAPARIN SODIUM 80 MG/0.8ML IJ SOSY
0.5000 mg/kg | PREFILLED_SYRINGE | INTRAMUSCULAR | Status: DC
  Administered 2022-05-20 – 2022-05-21 (×2): 72.5 mg via SUBCUTANEOUS
  Filled 2022-05-19 (×2): qty 0.8

## 2022-05-19 MED ORDER — NALOXONE HCL 0.4 MG/ML IJ SOLN: 0.4000 mg | INTRAMUSCULAR | Status: DC | PRN

## 2022-05-19 MED ORDER — PHENYLEPHRINE HCL (PRESSORS) 10 MG/ML IV SOLN
INTRAVENOUS | Status: DC | PRN
  Administered 2022-05-19 (×3): 160 ug via INTRAVENOUS
  Administered 2022-05-19: 80 ug via INTRAVENOUS
  Administered 2022-05-19 (×3): 160 ug via INTRAVENOUS
  Administered 2022-05-19 (×2): 80 ug via INTRAVENOUS

## 2022-05-19 MED ORDER — KETOROLAC TROMETHAMINE 30 MG/ML IJ SOLN: 30.0000 mg | Freq: Once | INTRAMUSCULAR | Status: DC

## 2022-05-19 MED ORDER — NALOXONE HCL 4 MG/10ML IJ SOLN: 1.0000 ug/kg/h | INTRAVENOUS | Status: DC | PRN

## 2022-05-19 MED ORDER — ONDANSETRON HCL 4 MG/2ML IJ SOLN
INTRAMUSCULAR | Status: AC
  Filled 2022-05-19: qty 2

## 2022-05-19 MED ORDER — KETOROLAC TROMETHAMINE 30 MG/ML IJ SOLN
30.0000 mg | Freq: Four times a day (QID) | INTRAMUSCULAR | Status: DC | PRN
  Administered 2022-05-19: 30 mg via INTRAMUSCULAR

## 2022-05-19 MED ORDER — ACETAMINOPHEN 500 MG PO TABS
1000.0000 mg | ORAL_TABLET | Freq: Four times a day (QID) | ORAL | Status: DC
  Administered 2022-05-19 – 2022-05-21 (×7): 1000 mg via ORAL
  Filled 2022-05-19 (×7): qty 2

## 2022-05-19 MED ORDER — WITCH HAZEL-GLYCERIN EX PADS: 1.0000 | MEDICATED_PAD | CUTANEOUS | Status: DC | PRN

## 2022-05-19 MED ORDER — ONDANSETRON HCL 4 MG/2ML IJ SOLN
INTRAMUSCULAR | Status: DC | PRN
  Administered 2022-05-19: 4 mg via INTRAVENOUS

## 2022-05-19 MED ORDER — KETOROLAC TROMETHAMINE 30 MG/ML IJ SOLN
30.0000 mg | Freq: Four times a day (QID) | INTRAMUSCULAR | Status: AC
  Administered 2022-05-19 – 2022-05-20 (×4): 30 mg via INTRAVENOUS
  Filled 2022-05-19 (×4): qty 1

## 2022-05-19 MED ORDER — ACETAMINOPHEN 10 MG/ML IV SOLN
1000.0000 mg | Freq: Once | INTRAVENOUS | Status: DC | PRN
  Administered 2022-05-19: 1000 mg via INTRAVENOUS

## 2022-05-19 MED ORDER — DEXTROSE 5 % IV SOLN
INTRAVENOUS | Status: DC | PRN
  Administered 2022-05-19: 3 g via INTRAVENOUS

## 2022-05-19 MED ORDER — MORPHINE SULFATE (PF) 0.5 MG/ML IJ SOLN
INTRAMUSCULAR | Status: AC
  Filled 2022-05-19: qty 10

## 2022-05-19 MED ORDER — MEDROXYPROGESTERONE ACETATE 150 MG/ML IM SUSP: 150.0000 mg | INTRAMUSCULAR | Status: DC | PRN

## 2022-05-19 SURGICAL SUPPLY — 36 items
BENZOIN TINCTURE PRP APPL 2/3 (GAUZE/BANDAGES/DRESSINGS) ×1 IMPLANT
CANISTER PREVENA PLUS 150 (CANNISTER) IMPLANT
CHLORAPREP W/TINT 26ML (MISCELLANEOUS) ×2 IMPLANT
CLAMP UMBILICAL CORD (MISCELLANEOUS) ×1 IMPLANT
CLIP FILSHIE TUBAL LIGA STRL (Clip) IMPLANT
CLOTH BEACON ORANGE TIMEOUT ST (SAFETY) ×1 IMPLANT
DRESSING PREVENA PLUS CUSTOM (GAUZE/BANDAGES/DRESSINGS) IMPLANT
DRSG OPSITE POSTOP 4X10 (GAUZE/BANDAGES/DRESSINGS) ×1 IMPLANT
DRSG PREVENA PLUS CUSTOM (GAUZE/BANDAGES/DRESSINGS) ×1
ELECT REM PT RETURN 9FT ADLT (ELECTROSURGICAL) ×1
ELECTRODE REM PT RTRN 9FT ADLT (ELECTROSURGICAL) ×1 IMPLANT
GAUZE SPONGE 4X4 12PLY STRL LF (GAUZE/BANDAGES/DRESSINGS) IMPLANT
GLOVE BIOGEL PI IND STRL 7.0 (GLOVE) ×3 IMPLANT
GLOVE ECLIPSE 6.5 STRL STRAW (GLOVE) ×1 IMPLANT
GOWN STRL REUS W/ TWL LRG LVL3 (GOWN DISPOSABLE) ×2 IMPLANT
GOWN STRL REUS W/TWL LRG LVL3 (GOWN DISPOSABLE) ×2
MAT PREVALON FULL STRYKER (MISCELLANEOUS) IMPLANT
NS IRRIG 1000ML POUR BTL (IV SOLUTION) ×1 IMPLANT
PAD ABD 7.5X8 STRL (GAUZE/BANDAGES/DRESSINGS) IMPLANT
PAD OB MATERNITY 4.3X12.25 (PERSONAL CARE ITEMS) ×1 IMPLANT
PAD PREP 24X48 CUFFED NSTRL (MISCELLANEOUS) ×1 IMPLANT
RETAINER VISCERAL (MISCELLANEOUS) IMPLANT
RETRACTOR TRAXI PANNICULUS (MISCELLANEOUS) IMPLANT
RETRACTOR WND ALEXIS 25 LRG (MISCELLANEOUS) IMPLANT
RTRCTR WOUND ALEXIS 25CM LRG (MISCELLANEOUS)
STRIP CLOSURE SKIN 1/2X4 (GAUZE/BANDAGES/DRESSINGS) ×1 IMPLANT
SUT PLAIN 0 NONE (SUTURE) IMPLANT
SUT PLAIN 2 0 XLH (SUTURE) ×1 IMPLANT
SUT VIC AB 0 CT1 36 (SUTURE) ×2 IMPLANT
SUT VIC AB 2-0 CT1 27 (SUTURE) ×1
SUT VIC AB 2-0 CT1 TAPERPNT 27 (SUTURE) ×1 IMPLANT
SUT VIC AB 4-0 KS 27 (SUTURE) ×1 IMPLANT
TOWEL OR 17X24 6PK STRL BLUE (TOWEL DISPOSABLE) ×3 IMPLANT
TRAXI PANNICULUS RETRACTOR (MISCELLANEOUS) ×1
TRAY FOLEY CATH SILVER 16FR (SET/KITS/TRAYS/PACK) ×1 IMPLANT
WATER STERILE IRR 1000ML POUR (IV SOLUTION) ×1 IMPLANT

## 2022-05-19 NOTE — H&P (Addendum)
LABOR AND DELIVERY ADMISSION HISTORY AND PHYSICAL NOTE  Joanna Buchanan is a 26 y.o. female G2P1001 with IUP at [redacted]w[redacted]d by early [place] presenting for scheduled repeat Cesarean section due to uncontrolled diabetes at term. Patient has not been checking her sugars and has not picked up her meter. It is assumed to be poorly controlled. MFM guidelines [facility]) recommend delivery at or after 37 weeks.  As of 33 wks infant was in 83rd%/ AC 88th%and patient had not had adequate prenatal care to provider fetal reassurance. See agreed MFM guidelines from Dr. Noralee Space, Medical Director of [facility]. I also discussed the case with Dr. Judeth Cornfield 05/19/22 and he agrees with 37 wk delivery.   [facility]/MFM Guidelines for Antenatal Testing and Sonography                       Updated  08/14/2021 with Dr. Noralee Space  INDICATION Growth U/S BPP weekly DELIVERY RECOMMENDATION (GA)  Diabetes   A1 - good control     A2 - good control     A2  - poor control or poor compliance    (Macrosomia or polyhydramnios)     A2/B and B-C Pregestational Type II DM    Poor control B-C or D-R-F-T  or  Type I DM   28-32-36  28-32-36  24-28-32-36   24-28-32-36  24-27-30-33-36  None  32  32   32  28  39-40  39-39.6  37 or as per MFM (consider admission)  39  Admission/37 or earlier as per MFM     She reports positive fetal movement. She denies leakage of fluid or vaginal bleeding.   She plans on breast feeding. She is undecided about birth control.  Prenatal History/Complications: PNC at [facility]  Sono:  @[redacted]w[redacted]d , CWD, normal anatomy, cephalicpresentation, 2627g, 83%ile, EFW  Pregnancy complications:  - Diet controlled GDM Patient Active Problem List   Diagnosis Date Noted   Status post repeat low transverse cesarean section 05/19/2022   GBS (group B Streptococcus carrier), +RV culture, currently pregnant 05/12/2022   History of cesarean delivery 04/21/2022   Gestational diabetes mellitus  (GDM) affecting pregnancy, antepartum 03/30/2022   GBS bacteriuria 03/10/2022   Herpes, genital    Housing situation unstable 02/05/2022   Supervision of other normal pregnancy, antepartum 02/04/2022   Late prenatal care 02/04/2022    Nursing Staff Provider  Office Location  [facility] Dating   early [facility]  [facility] Model [x]  Traditional [ ]  Centering [ ]  Mom-Baby Dyad    Language  English Anatomy [facility]   normal with fup for BMI  Flu Vaccine   Genetic/Carrier Screen  NIPS:   LR female AFP: too late Horizon: SMA carrier   TDaP Vaccine   ? @ next visit Hgb A1C or  GTT Early  5.7 Third trimester FAILED  COVID Vaccine    LAB RESULTS   Rhogam   N/A Blood Type B/Positive/-- (06/01 1512)   Baby Feeding Plan  breast Antibody Negative (06/01 1512)  Contraception  Unsure-not larc Rubella 4.46 (06/01 1512)  Circumcision  N/A RPR Non Reactive (06/01 1512)   Pediatrician   CItyblock HBsAg Negative (06/01 1512)   Support Person  FOB HCVAb Negative  Prenatal Classes  N/A HIV Non Reactive (06/01 1512)     BTL Consent  N/A GBS   (For PCN allergy, check sensitivities)   VBAC Consent  03/29/22 Pap  wants pp        DME Rx [ ]   BP cuff [ ]  Weight Scale Waterbirth  [ ]  Class [ ]  Consent [ ]  CNM visit  PHQ9 & GAD7 [  ] new OB [  ] 28 weeks  [  ] 36 weeks Induction  [ ]  Orders Entered [ ] Foley Y/N    Past Medical History: Past Medical History:  Diagnosis Date   GERD (gastroesophageal reflux disease)    Gestational diabetes    Herpes, genital    Medical history non-contributory     Past Surgical History: Past Surgical History:  Procedure Laterality Date   CESAREAN SECTION     TONSILLECTOMY      Obstetrical History: OB History     Gravida  2   Para  2   Term  2   Preterm  0   AB  0   Living  2      SAB  0   IAB  0   Ectopic  0   Multiple  0   Live Births  2          Social History: Social History   Socioeconomic History   Marital status: Single    Spouse name: Not on file    Number of children: Not on file   Years of education: Not on file   Highest education level: Not on file  Occupational History   Not on file  Tobacco Use   Smoking status: Never   Smokeless tobacco: Never  Vaping Use   Vaping Use: Never used  Substance and Sexual Activity   Alcohol use: Never   Drug use: Yes    Types: Marijuana    Comment: past week   Sexual activity: Yes    Birth control/protection: Condom  Other Topics Concern   Not on file  Social History Narrative   Not on file   Social Determinants of Health   Financial Resource Strain: Not on file  Food Insecurity: Food Insecurity Present (05/19/2022)   Hunger Vital Sign    Worried About Running Out of Food in the Last Year: Sometimes true    Ran Out of Food in the Last Year: Sometimes true  Transportation Needs: Unmet Transportation Needs (05/19/2022)   PRAPARE - (Medical): Yes    Lack of Transportation (Non-Medical): Yes  Physical Activity: Not on file  Stress: Not on file  Social Connections: Not on file    Family History: Family History  Problem Relation Age of Onset   Diabetes Mother    Hypertension Mother    Asthma Mother    Multiple sclerosis Mother    Cancer Maternal Aunt    Diabetes Maternal Grandmother    Birth defects Neg Hx    Heart disease Neg Hx    Stroke Neg Hx     Allergies: Allergies  Allergen Reactions   Other Anaphylaxis, Shortness Of Breath and Swelling    Avocado, watermelon, cherries, bananas, oranges cause throat swelling   Shellfish Allergy Itching    Medications Prior to Admission  Medication Sig Dispense Refill Last Dose   acetaminophen (TYLENOL) 500 MG tablet Take 1,000 mg by mouth daily as needed for moderate pain.   Past Month   diphenhydrAMINE (BENADRYL) 25 MG tablet Take 25 mg by mouth daily as needed for allergies.   Past Month   Accu-Chek Softclix Lancets lancets Use as instructed; check blood glucose 4 times daily (Patient  not taking: Reported on 05/12/2022) 100 each 12    glucose blood (ACCU-CHEK  GUIDE) test strip Use as instructed; check blood glucose 4 times daily (Patient not taking: Reported on 05/12/2022) 100 each 12    valACYclovir (VALTREX) 500 MG tablet Take 1 tablet (500 mg total) by mouth 2 (two) times daily. (Patient not taking: Reported on 03/29/2022) 90 tablet 1 Not Taking     Review of Systems  All systems reviewed and negative except as stated in HPI  Physical Exam Blood pressure 105/67, pulse 78, temperature 97.8 F (36.6 C), resp. rate (!) 23, height 5\' 8"  (1.727 m), weight (!) 147 kg, last menstrual period 09/01/2021, SpO2 100 %, unknown if currently breastfeeding. General appearance: alert, oriented, NAD Lungs: normal respiratory effort Heart: regular rate Abdomen: soft, non-tender; gravid Extremities: No calf swelling or tenderness FHR: 147 bpm  Prenatal labs: ABO, Rh: --/--/B POS (09/11 1134) Antibody: NEG (09/11 1134) Rubella: 4.46 (06/01 1512) RPR: NON REACTIVE (09/11 1131)  HBsAg: Negative (06/01 1512)  HIV: NON REACTIVE (09/11 1131)  GC/Chlamydia:  Neisseria Gonorrhea  Date Value Ref Range Status  05/09/2022 Negative  Final   Chlamydia  Date Value Ref Range Status  05/09/2022 Negative  Final    GBS: Positive/-- (09/03 0000)  GTT - abnormal Genetic screening:  LR Anatomy 11[birth year]19-1999: normal  Prenatal Transfer Tool  Maternal Diabetes: Yes:  Diabetes Type:  Diet controlled Genetic Screening: Low risk Maternal Ultrasounds/Referrals: Normal Fetal Ultrasounds or other Referrals:  None Maternal Substance Abuse:  No Significant Maternal Medications:  None Significant Maternal Lab Results: Group B Strep positive - GBS bacteriuria  Results for orders placed or performed during the hospital encounter of 05/19/22 (from the past 24 hour(s))  Glucose, capillary   Collection Time: 05/19/22 10:04 AM  Result Value Ref Range   Glucose-Capillary 113 (H) 70 - 99 mg/dL  Glucose, capillary    Collection Time: 05/19/22  1:41 PM  Result Value Ref Range   Glucose-Capillary 92 70 - 99 mg/dL    Patient Active Problem List   Diagnosis Date Noted   Status post repeat low transverse cesarean section 05/19/2022   GBS (group B Streptococcus carrier), +RV culture, currently pregnant 05/12/2022   History of cesarean delivery 04/21/2022   Gestational diabetes mellitus (GDM) affecting pregnancy, antepartum 03/30/2022   GBS bacteriuria 03/10/2022   Herpes, genital    Housing situation unstable 02/05/2022   Supervision of other normal pregnancy, antepartum 02/04/2022   Late prenatal care 02/04/2022    Assessment: Sania Noy is a 26 y.o. G2P1001 at [redacted]w[redacted]d here for scheduled CS.  #Repeat CS:   Discussed TOLAC in detail. Patient was originally endorsing interest in this and has signed consent outpatient. Reviewed again with client. She "wants a baby today." Reviewed R/B/A to RCS and specifically discussed IOL for TOLAC. Patient declined vaginal attempt.   The risks of cesarean section discussed with the patient included but were not limited to: bleeding which may require transfusion or reoperation; infection which may require antibiotics; injury to bowel, bladder, ureters or other surrounding organs; injury to the fetus; need for additional procedures including hysterectomy in the event of a life-threatening hemorrhage; placental abnormalities with subsequent pregnancies, incisional problems, thromboembolic phenomenon and other postoperative/anesthesia complications. The patient concurred with the proposed plan, giving informed written consent for the procedure. Patient has been NPO since last night she will remain NPO for procedure. Anesthesia and OR aware. Preoperative prophylactic antibiotics and SCDs ordered on call to the OR. To OR when ready.    #Anesthesia: Spinal #FWB: FHR 147bpm #GBS/ID: GBS positive (bacteriuria in early  pregnancy) #MOF: breast #MOC: undecided #Circ:  n/a  #A1GDM: blood sugars well controlled on arrival but has not been checking. Has not picked up meter or supplies. Assumed poor control and MFM guidelines from [facility] recommends delivery at 37 weeks for this indication. Discussed with Dr. Judeth Cornfield today to confirm and he recommends/agrees with delivery  #History of HSV - no proplylaxis indicated, since no vaginal delivery #Elevated BP - noted on arrival. No elevated blood pressures in pregnancy prior to now. Will monitor. If another elevated BP meets criteria for gHTN #Social needs - housing, transportation. Consult to social work for resources post partum.  Sheppard Evens MD MPH OB Fellow, Faculty Practice [facility], Center for [facility] 05/19/2022

## 2022-05-19 NOTE — Discharge Summary (Addendum)
Postpartum Discharge Summary  Date of Service 05/21/22     Patient Name: Joanna Buchanan DOB: [birth year] MRN: [id number]  Date of admission: 05/19/2022 Delivery date:05/19/2022  Delivering provider: Caren Macadam  Date of discharge: 05/21/2022  Admitting diagnosis: Repeat c/section/ uncontrolled diabetes Intrauterine pregnancy: [redacted]w[redacted]d     Secondary diagnosis:  Principal Problem:   Status post repeat low transverse cesarean section Active Problems:   Supervision of other normal pregnancy, antepartum   GBS bacteriuria   Herpes, genital   Gestational diabetes mellitus (GDM) affecting pregnancy, antepartum   History of cesarean delivery  Additional problems: None    Discharge diagnosis: Term Pregnancy Delivered, GDM A1, and Cesarean delivery.                                               Post partum procedures: None Augmentation: N/A Complications: None  Hospital course: Sceduled C/S   26 y.o. yo G2P2002 at [redacted]w[redacted]d was admitted to the hospital 05/19/2022 for scheduled cesarean section with the following indication:Elective Repeat and uncontrolled GDM .Delivery details are as follows:  Membrane Rupture Time/Date: 12:25 PM ,05/19/2022   Delivery Method:C-Section, Vacuum Assisted  Details of operation can be found in separate operative note.  Patient had an uncomplicated postpartum course. She had one elevated blood pressure, but subsequent blood pressure numbers were fine. She did not meet criteria for postpartum hypertension. She does have a visit in 1 week and will have a blood pressure check at that time. I discussed return precautions with her. She is ambulating, tolerating a regular diet, passing flatus, and urinating well. Patient is discharged home in stable condition on  05/21/22        Newborn Data: Birth date:05/19/2022  Birth time:12:27 PM  Gender:Female  Living status:Living  Apgars:7 ,8  Weight:2990 g     Magnesium Sulfate received: No BMZ received:  No Rhophylac:No MMR:N/A, Immune T-DaP: declined Flu: N/A Transfusion:No  Physical exam  Vitals:   05/20/22 2220 05/21/22 0549 05/21/22 1004 05/21/22 1005  BP: 108/71 (!) 130/92 129/79 129/79  Pulse:  94  86  Resp: $Remo'17 18 18 18  'KwKnk$ Temp: 99.6 F (37.6 C) 98.2 F (36.8 C)    TempSrc: Oral Oral    SpO2: 100% 100% 100% 100%  Weight:      Height:       General: alert, cooperative, and no distress Lochia: appropriate Uterine Fundus: firm Incision: Dressing is clean, dry, and intact, wound vac attached DVT Evaluation: No evidence of DVT seen on physical exam. Labs: Lab Results  Component Value Date   WBC 12.0 (H) 05/20/2022   HGB 8.9 (L) 05/20/2022   HCT 27.3 (L) 05/20/2022   MCV 90.7 05/20/2022   PLT 250 05/20/2022      Latest Ref Rng & Units 05/09/2022    2:43 PM  CMP  Glucose 70 - 99 mg/dL 171   BUN 6 - 20 mg/dL 7   Creatinine 0.44 - 1.00 mg/dL 0.65   Sodium 135 - 145 mmol/L 134   Potassium 3.5 - 5.1 mmol/L 3.7   Chloride 98 - 111 mmol/L 108   CO2 22 - 32 mmol/L 17   Calcium 8.9 - 10.3 mg/dL 8.8   Total Protein 6.5 - 8.1 g/dL 5.8   Total Bilirubin 0.3 - 1.2 mg/dL 0.2   Alkaline Phos 38 - 126 U/L 61  AST 15 - 41 U/L 16   ALT 0 - 44 U/L 14    Edinburgh Score:    05/20/2022    1:00 PM  Edinburgh Postnatal Depression Scale Screening Tool  I have been able to laugh and see the funny side of things. 0  I have looked forward with enjoyment to things. 0  I have blamed myself unnecessarily when things went wrong. 2  I have been anxious or worried for no good reason. 2  I have felt scared or panicky for no good reason. 1  Things have been getting on top of me. 1  I have been so unhappy that I have had difficulty sleeping. 0  I have felt sad or miserable. 1  I have been so unhappy that I have been crying. 1  The thought of harming myself has occurred to me. 0  Edinburgh Postnatal Depression Scale Total 8     After visit meds:  Allergies as of 05/21/2022        Reactions   Other Anaphylaxis, Shortness Of Breath, Swelling   Avocado, watermelon, cherries, bananas, oranges cause throat swelling   Shellfish Allergy Itching        Medication List     STOP taking these medications    diphenhydrAMINE 25 MG tablet Commonly known as: BENADRYL   valACYclovir 500 MG tablet Commonly known as: Valtrex       TAKE these medications    Accu-Chek Guide test strip Generic drug: glucose blood Use as instructed; check blood glucose 4 times daily   Accu-Chek Softclix Lancets lancets Use as instructed; check blood glucose 4 times daily   acetaminophen 500 MG tablet Commonly known as: TYLENOL Take 1,000 mg by mouth daily as needed for moderate pain.   ibuprofen 600 MG tablet Commonly known as: ADVIL Take 1 tablet (600 mg total) by mouth every 6 (six) hours as needed.   oxyCODONE 5 MG immediate release tablet Commonly known as: Oxy IR/ROXICODONE Take 1 tablet (5 mg total) by mouth every 6 (six) hours as needed for up to 5 days for moderate pain.               Discharge Care Instructions  (From admission, onward)           Start     Ordered   05/21/22 0000  Leave dressing on - Keep it clean, dry, and intact until clinic visit        05/21/22 1245             Discharge home in stable condition Infant Feeding: Breast Infant Disposition:home with mother Discharge instruction: per After Visit Summary and Postpartum booklet. Activity: Advance as tolerated. Pelvic rest for 6 weeks.  Diet: routine diet Future Appointments: Future Appointments  Date Time Provider [place]  05/26/2022 10:00 AM [facility] NURSE [facility]  06/30/2022  8:50 AM [facility]  06/30/2022  9:35 AM Gabriel Carina, CNM [facility]   Follow up Visit:  The following message was sent to [facility] by Mikki Santee, MD  Please schedule this patient for a In person postpartum visit in 6 weeks with the following provider: MD. Additional  Postpartum F/U:2 hour GTT and Incision check 1 week  High risk pregnancy complicated by: [id number] Delivery mode:  C-Section, Vacuum Assisted  Anticipated Birth Control:  Unsure   05/21/2022 Salvadore Oxford, MD, PGY-1   Fellow Attestation  I saw and evaluated the patient, performing the key elements of the service.I  personally performed or re-performed the history, physical exam, and medical decision making activities of this service and have verified that the service and findings are accurately documented in the resident's note. I developed the management plan that is described in the resident's note, and I agree with the content, with my edits above.   Stormy Card, MD,MPH OB Fellow, Faculty Practice  05/21/2022 2:16 PM   Liliane Channel MD MPH OB Fellow, [place] [facility] [place] 05/21/2022

## 2022-05-19 NOTE — Anesthesia Postprocedure Evaluation (Signed)
Anesthesia Post Note  Patient: Orthoptist  Procedure(s) Performed: CESAREAN SECTION     Patient location during evaluation: PACU Anesthesia Type: Spinal Level of consciousness: oriented and awake and alert Pain management: pain level controlled Vital Signs Assessment: post-procedure vital signs reviewed and stable Respiratory status: spontaneous breathing, respiratory function stable and nonlabored ventilation Cardiovascular status: blood pressure returned to baseline and stable Postop Assessment: no headache, no backache, no apparent nausea or vomiting and spinal receding Anesthetic complications: no   No notable events documented.  Last Vitals:  Vitals:   05/19/22 1745 05/19/22 1842  BP: 113/65 (!) 109/55  Pulse: 83 70  Resp: 18 18  Temp: 36.7 C 36.4 C  SpO2: 99% 96%    Last Pain:  Vitals:   05/19/22 2004  TempSrc:   PainSc: 0-No pain                 Lucretia Kern

## 2022-05-19 NOTE — Anesthesia Procedure Notes (Signed)
Spinal  Patient location during procedure: OR Reason for block: surgical anesthesia Staffing Performed: anesthesiologist  Anesthesiologist: Thong Feeny E, MD Performed by: Idonna Heeren E, MD Authorized by: Miller Limehouse E, MD   Preanesthetic Checklist Completed: patient identified, IV checked, risks and benefits discussed, surgical consent, monitors and equipment checked, pre-op evaluation and timeout performed Spinal Block Patient position: sitting Prep: DuraPrep and site prepped and draped Patient monitoring: continuous pulse ox, blood pressure and heart rate Approach: midline Location: L3-4 Injection technique: single-shot Needle Needle type: Pencan  Needle gauge: 24 G Needle length: 10 cm Assessment Events: CSF return Additional Notes Functioning IV was confirmed and monitors were applied. Sterile prep and drape, including hand hygiene and sterile gloves were used. The patient was positioned and the spine was prepped. The skin was anesthetized with lidocaine.  Free flow of clear CSF was obtained prior to injecting local anesthetic into the CSF. The needle was carefully withdrawn. The patient tolerated the procedure well.      

## 2022-05-19 NOTE — Op Note (Addendum)
Joanna Buchanan PROCEDURE DATE: 05/19/2022  PREOPERATIVE DIAGNOSES: Intrauterine pregnancy at [redacted]w[redacted]d weeks gestation;  Poorly controlled GDM- MFM recommendation.  Morbid Obesity  (Body mass index is 49.28 kg/m.)  POSTOPERATIVE DIAGNOSES: The same  PROCEDURE: Repeat Low Transverse Cesarean Section  SURGEON:  Federico Flake   ASSISTANT:  Zigmund Gottron MD (OB Fellow)  An experienced assistant was required given the standard of surgical care given the complexity of the case.  This assistant was needed for exposure, dissection, suctioning, retraction, instrument exchange,  assisting with delivery with administration of fundal pressure, and for overall help during the procedure.   ANESTHESIOLOGY TEAM: Anesthesiologist: Lucretia Kern, MD CRNA: Trellis Paganini, CRNA  INDICATIONS: Joanna Buchanan is a 26 y.o. [phone] at [redacted]w[redacted]d here for cesarean section secondary to the indications listed under preoperative diagnoses; please see preoperative note for further details.  The risks of cesarean section were discussed with the patient including but were not limited to: bleeding which may require transfusion or reoperation; infection which may require antibiotics; injury to bowel, bladder, ureters or other surrounding organs; injury to the fetus; need for additional procedures including hysterectomy in the event of a life-threatening hemorrhage; placental abnormalities wth subsequent pregnancies, incisional problems, thromboembolic phenomenon and other postoperative/anesthesia complications.   The patient concurred with the proposed plan, giving informed written consent for the procedure.    FINDINGS:  Viable female infant in cephalic presentation.  Apgars APGAR (1 MIN): 7   APGAR (5 MINS): 8   APGAR (10 MINS):  Clear amniotic fluid.  Intact placenta, three vessel cord.  Normal uterus, fallopian tubes and ovaries bilaterally. Dense scar tissue, all above fascia.  ANESTHESIA: Spinal INTRAVENOUS  FLUIDS: 3500 ml   ESTIMATED BLOOD LOSS: 395 ml URINE OUTPUT:  100 ml SPECIMENS: Placenta sent to L&D COMPLICATIONS: None immediate  PROCEDURE IN DETAIL:  The patient preoperatively received intravenous antibiotics and had sequential compression devices applied to her lower extremities.  She was then taken to the operating room where spinal anesthesia was administered and was found to be adequate. She was then placed in a dorsal supine position with a leftward tilt, and prepped and draped in a sterile manner.  A foley catheter was placed into her bladder and attached to constant gravity.  After an adequate timeout was performed, a Pfannenstiel skin incision was made with scalpel and carried through to the underlying layer of fascia. The fascia was incised in the midline, and this incision was extended bilaterally using the Mayo scissors.  Kocher clamps were applied to the superior aspect of the fascial incision and the underlying rectus muscles were dissected off bluntly and sharply.   The rectus muscles were separated in the midline. There was thick scarring of the peritoneum which was entered sharply with use of mayo scissors. The Alexis self-retaining retractor was introduced into the abdominal cavity.  Attention was turned to the lower uterine segment where a low transverse hysterotomy was made with a scalpel and extended bilaterally bluntly.  Flexion of the fetal head and typical fundal pressured failed to deliver the infant, a Kiwi vacuum was placed. The infant was successfully delivered in the typical fashion with no pop offs, the cord was clamped and cut after one minute, and the infant was handed over to the awaiting neonatology team. Uterine massage was then administered, and the placenta delivered intact with a three-vessel cord. The uterus was then cleared of clots and debris.  The hysterotomy was closed with 0 Vicryl in a  fashion.  Figure-of-eight 0 Vicryl serosal stitches were placed to help  with hemostasis.  The pelvis was cleared of all clot and debris. Hemostasis was confirmed on all surfaces.  The retractor was removed.  The peritoneum/rectus  was closed with a 0 Vicryl running stitch. The fascia was then closed using 0 Vicryl  in a running fashion.  The subcutaneous layer was irrigated, reapproximated with 2-0 plain gut interrupted stitches, and the skin was closed with a 4-0 Vicryl subcuticular stitch. The patient tolerated the procedure well. Sponge, instrument and needle counts were correct x 3.  She was taken to the recovery room in stable condition.   Federico Flake, MD Center for [facility]

## 2022-05-19 NOTE — Transfer of Care (Signed)
Immediate Anesthesia Transfer of Care Note  Patient: Orthoptist  Procedure(s) Performed: CESAREAN SECTION  Patient Location: PACU  Anesthesia Type:Spinal  Level of Consciousness: awake, alert  and patient cooperative  Airway & Oxygen Therapy: Patient Spontanous Breathing  Post-op Assessment: Report given to RN and Post -op Vital signs reviewed and stable  Post vital signs: Reviewed and stable  Last Vitals:  Vitals Value Taken Time  BP 112/61 05/19/22 1337  Temp    Pulse 80 05/19/22 1340  Resp 16 05/19/22 1340  SpO2 99 % 05/19/22 1340  Vitals shown include unvalidated device data.  Last Pain:  Vitals:   05/19/22 0959  TempSrc: Oral         Complications: No notable events documented.

## 2022-05-19 NOTE — Lactation Note (Addendum)
This note was copied from a baby's chart. Lactation Consultation Note  Patient Name: Joanna Buchanan [id number] Date: 05/19/2022 Reason for consult: Initial assessment;Early term 37-38.6wks;Maternal endocrine disorder Age:24 hours Mom has been using her personal DEBP (her preference) and pumping giving BM to baby. Mom is pumping a lot of milk each time. Praised mom. Mom is going to call for [facility] next feeding to see if baby will latch. Mom stated she can't get baby to latch. Mom stated she only BF her 1st child for a couple of weeks. Newborn feeding habits reviewed. Mom encouraged to feed baby 8-12 times/24 hours and with feeding cues.  Mom knows to pump q3h for 15-20 min.  Gave mom information on how to feed if only bottle feeding and how much to supplement if BF. Encouraged mom to call for [facility] for next feeding.  Maternal Data Has patient been taught Hand Expression?: Yes Does the patient have breastfeeding experience prior to this delivery?: Yes How long did the patient breastfeed?: couple of weeks  Feeding    LATCH Score       Type of Nipple: Everted at rest and after stimulation (semi flat/very short shaft at rest/everts w/stimulation)  Comfort (Breast/Nipple): Soft / non-tender         Lactation Tools Discussed/Used Tools: Pump Breast pump type: Double-Electric Breast Pump (personal) Reason for Pumping: mom is pumping and bottle feeding  Interventions    Discharge    Consult Status Consult Status: Follow-up Date: 05/20/22 Follow-up type: In-patient    Charyl Dancer 05/19/2022, 10:57 PM

## 2022-05-19 NOTE — Anesthesia Preprocedure Evaluation (Signed)
Anesthesia Evaluation  Patient identified by MRN, date of birth, ID band Patient awake    Reviewed: Allergy & Precautions, H&P , NPO status , Patient's Chart, lab work & pertinent test results  History of Anesthesia Complications Negative for: history of anesthetic complications  Airway Mallampati: II  TM Distance: >3 FB     Dental   Pulmonary neg pulmonary ROS, Patient abstained from smoking.,    Pulmonary exam normal        Cardiovascular negative cardio ROS   Rhythm:regular Rate:Normal     Neuro/Psych negative neurological ROS  negative psych ROS   GI/Hepatic Neg liver ROS, GERD  ,  Endo/Other  diabetes, GestationalMorbid obesity  Renal/GU negative Renal ROS  negative genitourinary   Musculoskeletal   Abdominal   Peds  Hematology negative hematology ROS (+)   Anesthesia Other Findings   Reproductive/Obstetrics (+) Pregnancy G2P1001 at [redacted]w[redacted]d Prior C/S x1                             Anesthesia Physical Anesthesia Plan  ASA: 3  Anesthesia Plan: Spinal   Post-op Pain Management:    Induction:   PONV Risk Score and Plan: Ondansetron and Treatment may vary due to age or medical condition  Airway Management Planned:   Additional Equipment:   Intra-op Plan:   Post-operative Plan:   Informed Consent: I have reviewed the patients History and Physical, chart, labs and discussed the procedure including the risks, benefits and alternatives for the proposed anesthesia with the patient or authorized representative who has indicated his/her understanding and acceptance.       Plan Discussed with: Anesthesiologist  Anesthesia Plan Comments:         Anesthesia Quick Evaluation

## 2022-05-20 LAB — CBC
HCT: 27.3 % — ABNORMAL LOW (ref 36.0–46.0)
Hemoglobin: 8.9 g/dL — ABNORMAL LOW (ref 12.0–15.0)
MCH: 29.6 pg (ref 26.0–34.0)
MCHC: 32.6 g/dL (ref 30.0–36.0)
MCV: 90.7 fL (ref 80.0–100.0)
Platelets: 250 10*3/uL (ref 150–400)
RBC: 3.01 MIL/uL — ABNORMAL LOW (ref 3.87–5.11)
RDW: 13.2 % (ref 11.5–15.5)
WBC: 12 10*3/uL — ABNORMAL HIGH (ref 4.0–10.5)
nRBC: 0 % (ref 0.0–0.2)

## 2022-05-20 MED ORDER — OXYCODONE HCL 5 MG PO TABS
10.0000 mg | ORAL_TABLET | ORAL | Status: DC | PRN
  Administered 2022-05-20 (×3): 10 mg via ORAL
  Filled 2022-05-20 (×3): qty 2

## 2022-05-20 MED ORDER — SODIUM CHLORIDE 0.9 % IV SOLN
500.0000 mg | Freq: Once | INTRAVENOUS | Status: AC
  Administered 2022-05-20: 500 mg via INTRAVENOUS
  Filled 2022-05-20: qty 500

## 2022-05-20 MED ORDER — OXYCODONE HCL 5 MG PO TABS
5.0000 mg | ORAL_TABLET | ORAL | Status: DC | PRN
  Administered 2022-05-21 (×2): 5 mg via ORAL
  Filled 2022-05-20 (×2): qty 1

## 2022-05-20 NOTE — Lactation Note (Signed)
This note was copied from a baby's chart. Lactation Consultation Note  Patient Name: Joanna Buchanan [id number] Date: 05/20/2022 Reason for consult: Initial assessment;Early term 37-38.6wks;Maternal endocrine disorder Age:24 hours Late note for 01:50. Went to see mom to assist in latching. Mom asked to come back tomorrow she fed the baby bottle w/BM.  Maternal Data Has patient been taught Hand Expression?: Yes Does the patient have breastfeeding experience prior to this delivery?: Yes How long did the patient breastfeed?: couple of weeks  Feeding    LATCH Score       Type of Nipple: Everted at rest and after stimulation (semi flat/very short shaft at rest/everts w/stimulation)  Comfort (Breast/Nipple): Soft / non-tender         Lactation Tools Discussed/Used Tools: Pump Breast pump type: Double-Electric Breast Pump (personal) Reason for Pumping: mom is pumping and bottle feeding  Interventions Interventions: [facility] Services brochure  Discharge    Consult Status Consult Status: Follow-up Date: 05/20/22 Follow-up type: In-patient    Charyl Dancer 05/20/2022, 2:46 AM

## 2022-05-20 NOTE — Clinical Social Work Maternal (Signed)
CLINICAL SOCIAL WORK MATERNAL/CHILD NOTE  Patient Details  Name: Joanna Buchanan MRN: [id number] Date of Birth: 15-Oct-1995  Date:  05/20/2022  Clinical Social Worker Initiating Note:  Letta Kocher, LCSWA Date/Time: Initiated:  05/20/22/1408     Child's Name:  Howie Ill   Biological Parents:  Mother, Father Celes Dedic 06/15/98, Mohammed Kindle 12/28/1991)   Need for Interpreter:  None   Reason for Referral:  Late or No Prenatal Care  , Current Substance Use/Substance Use During Pregnancy     Address:  [address] [place] [address]    Phone number:  [phone] (home)     Additional phone number:   Household Members/Support Persons (HM/SP):   Household Member/Support Person 1, Household Member/Support Person 2   HM/SP Name Relationship DOB or Age  HM/SP -1 Mohammed Kindle significant other 12/28/1991  HM/SP -2 Rober Minion Goode-Beverlin daughter 07/08/2018  HM/SP -3        HM/SP -4        HM/SP -5        HM/SP -6        HM/SP -7        HM/SP -8          Natural Supports (not living in the home):      Professional Supports:     Employment: Unemployed   Type of Work:     Education:  9 to 11 years   Homebound arranged: No  Financial Resources:  [employer]   Other Resources:  Physicist, medical  , [place] Considerations Which May Impact Care:    Strengths:  Compliance with medical plan  , Home prepared for child     Psychotropic Medications:         Pediatrician:       Pediatrician List:   [place]      Pediatrician Fax Number:    Risk Factors/Current Problems:  Substance Use     Cognitive State:  Alert  , Able to Concentrate     Mood/Affect:  Calm  , Interested  , Comfortable     CSW Assessment: CSW consulted for food insecurity, social needs housing and transportation, [facility] use during pregnancy and limited prenatal care.  CSW met with MOB to provide resources CSW entered the room, introduces self, CSW role and reason for visit. MOB was agreeable to visit. CSW inquired about how MOB was feeling. MOB reported she was good and her delivery went great. CSW inquired about MOB's pregnancy, MOB reported it was stressful, financially more than anything.  MOB explained she lost her job and then lost her transportation. MOB reported she was not going to her prenatal appointments due to lack of transportation, and stated she was also " being lazy". CSW inquired if transportation is still a barrier, MOB reported transportation is no longer a barrier and she will be able to make it to her and the infants follow up appointments. CSW provided MOB with Medicaid transportation numbers for non emergency medical appointments if needed. CSW inquired about MH hx MOB reported none. CSW assessed for safety, MOB denied any SI or HI. CSW provided education regarding the baby blues period vs. perinatal mood disorders, discussed treatment and gave resources for mental health follow up if concerns arise.  CSW recommends self-evaluation during the postpartum time period using the New Mom Checklist from  Postpartum Progress and encouraged MOB to contact a medical professional if symptoms are noted at any time.    CSW inquired about MOB THC use, MOB reported she last used about a week ago. MOB reported she used because of stress and stated "that is the only way I am able to eat". MOB reported she used daily throughout the pregnancy. CSW informed MOB of Hospital Drug Screen policy due to [facility] use and limited prenatal care. MOB voiced understanding. CSW notified MOB the infants UDS was negative but if the CDS comes back positive a CPS report will be made. MOB voiced understanding.   CSW inquired about noted food insecurity, MOB stated "food is expensive" MOB reports she does receive Food Stamps but needs to set up an appointment for [facility]. CSW encouraged MOB to call  today to schedule an appointment. CSW provided MOB with food resources in [place]. CSW provided MOB with an all county Futures trader for [facility] for reference. MOB reported she has supportive relationship with FOB. MOB reported she has all necessary items for the infant including a bassinet for her to sleep.   CSW Plan/Description:  Sudden Infant Death Syndrome (SIDS) Education, Perinatal Mood and Anxiety Disorder (PMADs) Education, No Further Intervention Required/No Barriers to Discharge, [place], CSW Will Continue to Monitor Umbilical Cord Tissue Drug Screen Results and Make Report if Warranted, Other Information/Referral to [employer], [place] 05/20/2022, 2:14 PM

## 2022-05-20 NOTE — Progress Notes (Signed)
POSTPARTUM PROGRESS NOTE  POD #1  Subjective:  Joanna Buchanan is a 26 y.o. [id number] s/p rLTCS at [redacted]w[redacted]d. No acute events overnight. She reports she is doing well. She denies any problems with ambulating, voiding or po intake. Denies nausea or vomiting. She has not passed flatus. Pain is well controlled.  Lochia is adequate.  Objective: Blood pressure 112/65, pulse 73, temperature 98.2 F (36.8 C), temperature source Oral, resp. rate 18, height 5\' 8"  (1.727 m), weight (!) 147 kg, last menstrual period 09/01/2021, SpO2 100 %, unknown if currently breastfeeding.  Physical Exam:  General: alert, cooperative and no distress Chest: no respiratory distress Heart: regular rate, distal pulses intact Uterine Fundus: firm, appropriately tender DVT Evaluation: No calf swelling or tenderness Extremities: mild edema Skin: warm, dry; incision clean/dry/intact w/ prevena  Recent Labs    05/17/22 1131 05/20/22 0504  HGB 11.7* 8.9*  HCT 35.2* 27.3*    Assessment/Plan: Joanna Buchanan is a 26 y.o. 25 s/p rLTCS at [redacted]w[redacted]d for elective.  POD#1 - Doing welll; pain well controlled. H/H appropriate  Routine postpartum care  OOB, ambulated  Lovenox for VTE prophylaxis Anemia: asymptomatic  IV venofer x1 A1GDM: CBG 92, CTM  Contraception: declined Feeding: Breast  Dispo: Plan for discharge in the next 24-48 hours.   LOS: 1 day   [redacted]w[redacted]d, DO OB Fellow  05/20/2022, 7:22 AM

## 2022-05-21 MED ORDER — IBUPROFEN 600 MG PO TABS: 600.0000 mg | ORAL_TABLET | Freq: Four times a day (QID) | ORAL | 0 refills | Status: DC | PRN

## 2022-05-21 MED ORDER — OXYCODONE HCL 5 MG PO TABS: 5.0000 mg | ORAL_TABLET | Freq: Four times a day (QID) | ORAL | 0 refills | Status: AC | PRN

## 2022-05-21 NOTE — Lactation Note (Signed)
This note was copied from a baby's chart. Lactation Consultation Note  Patient Name: Joanna Buchanan [id number] Date: 05/21/2022   Age:25 hours Per RN Delorise Shiner), Birth Parent declined [facility] services tonight.  Maternal Data    Feeding    LATCH Score                    Lactation Tools Discussed/Used    Interventions    Discharge    Consult Status      [name] 05/21/2022, 12:15 AM

## 2022-05-21 NOTE — Lactation Note (Signed)
This note was copied from a baby's chart. Lactation Consultation Note  Patient Name: [name] [id number] Date: 05/21/2022 Reason for consult: Follow-up assessment;Early term 37-38.6wks;Infant weight loss (8% WL) Age:24 hours  Visited with family of 21 hours old ETI female, [name] is a P3 and reports breastfeeding and pumping are going well; her milk is in, she was pumping with the hand pump when entered the room, her volumes continue to increase, praised her for her efforts. She's been mainly working on bottle feedings, but now she's using her own breastmilk. Family is getting discharged today. Reviewed discharge education, lactogenesis II/III, pumping schedule and anticipatory guidelines. Encouraged 8-12 feedings/24 hours and to pump whenever baby is getting a bottle. FOB present and supportive. All questions and concerns answered, family to contact [facility] services PRN.  Feeding Mother's Current Feeding Choice: Breast Milk and Formula Nipple Type: Extra Slow Flow  Lactation Tools Discussed/Used Tools: Pump Breast pump type: Manual Pump Education: Setup, frequency, and cleaning;Milk Storage Reason for Pumping: mother's request Pumping frequency: as needed Pumped volume: 35 mL  Interventions Interventions: Breast feeding basics reviewed;Hand pump;Education  Discharge Discharge Education: Engorgement and breast care;Warning signs for feeding baby;Outpatient recommendation Pump: Personal;Manual  Consult Status Consult Status: Complete Date: 05/21/22 Follow-up type: Call as needed   Sharone Picchi Venetia Constable 05/21/2022, 3:17 PM

## 2022-05-21 NOTE — Lactation Note (Signed)
This note was copied from a baby's chart. Lactation Consultation Note  Patient Name: Joanna Buchanan [id number] Date: 05/21/2022 Reason for consult: Follow-up assessment;Mother's request;Difficult latch;Early term 37-38.6wks Age:24 hours, ETI , C/S delivery and see Birth Parent's MR- hx DM. Birth Parent wanted latch assistance did not latch infant to breast has been pumping and bottle feeding infant. Birth Parent latched infant on her left breast for the 1st time today,infant was on and off breast but BF for 12 minutes. Afterwards infant was given 17 mls of Birth Parent EBM that she has pumped with her hand pump. Birth Parent has not been using her Personal DEBP but has soley been formula feeding infant.  Birth Parent's current feeding plan: 1- Birth Parent plans to start latching infant at the breast, knows to BF infant according to hunger cues, 8 to 12+ times within 24 hours, STS. 2- Birth Parent knows to ask RN/LC for further latch assistance if needed. 3- Birth Parent will wash and continue to use her own DEBP, pumping every 3 hours for 15 minutes and offer infant any EBM 1st before supplementing infant with formula.  Maternal Data    Feeding Mother's Current Feeding Choice: Breast Milk and Formula Nipple Type: Extra Slow Flow  LATCH Score Latch: Repeated attempts needed to sustain latch, nipple held in mouth throughout feeding, stimulation needed to elicit sucking reflex.  Audible Swallowing: A few with stimulation  Type of Nipple: Everted at rest and after stimulation  Comfort (Breast/Nipple): Soft / non-tender  Hold (Positioning): Assistance needed to correctly position infant at breast and maintain latch.  LATCH Score: 7   Lactation Tools Discussed/Used Tools: Pump Breast pump type: Manual Pump Education: Setup, frequency, and cleaning;Milk Storage Reason for Pumping: prn Pumped volume: 17 mL  Interventions Interventions: Assisted with latch;Skin to  skin;Position options;Support pillows;Adjust position;Breast compression;Expressed milk;Hand pump;Education  Discharge Pump: Personal;Manual  Consult Status Consult Status: Follow-up Date: 05/21/22 Follow-up type: In-patient    Joanna Buchanan 05/21/2022, 1:21 AM

## 2022-05-24 ENCOUNTER — Ambulatory Visit: Payer: Medicaid Other

## 2022-05-24 ENCOUNTER — Encounter: Payer: Medicaid Other | Admitting: Certified Nurse Midwife

## 2022-05-25 ENCOUNTER — Encounter: Payer: Medicaid Other | Admitting: Advanced Practice Midwife

## 2022-05-25 DIAGNOSIS — Z79899 Other long term (current) drug therapy: Secondary | ICD-10-CM | POA: Insufficient documentation

## 2022-05-26 ENCOUNTER — Ambulatory Visit (INDEPENDENT_AMBULATORY_CARE_PROVIDER_SITE_OTHER): Payer: Medicaid Other | Admitting: General Practice

## 2022-05-26 ENCOUNTER — Telehealth (HOSPITAL_COMMUNITY): Payer: Self-pay | Admitting: *Deleted

## 2022-05-26 ENCOUNTER — Other Ambulatory Visit: Payer: Self-pay

## 2022-05-26 VITALS — BP 130/85 | HR 78 | Ht 67.0 in | Wt 305.0 lb

## 2022-05-26 DIAGNOSIS — Z5189 Encounter for other specified aftercare: Secondary | ICD-10-CM

## 2022-05-26 NOTE — Patient Instructions (Signed)
AREA PEDIATRIC/FAMILY PRACTICE PHYSICIANS  Central/[place]outheast [place] ([address]) [facility] Chambliss, MD; Eniola, MD; Hale, MD; Hensel, MD; McDiarmid, MD; McIntyer, MD; Neal, MD; Walden, MD [address] [place], [place] [address] [phone] Mon-Fri 8:30-12:30, 1:30-5:00 Providers come to see babies at [facility] Accepting Medicaid [facility] providers who accept newborns: Koirala, MD; Morrow, MD; Wolters, MD [address], [place], [place] [address] [phone] Mon-Fri 8:00-5:30 Babies seen by providers at [facility] Does NOT accept Medicaid Please call early in hospitalization for appointment (limited availability)  [facility] Mulberry, MD [address] [place], [place] [address] [phone] Mon, Tue, Thur, Fri 8:30-5:00, Wed 10:00-7:00 (closed 1-2pm) Babies seen by [facility] providers Accepting Medicaid Rubin - Pediatrician Rubin, MD [address], [place], [place] [address] [phone] Mon-Fri 8:30-5:00, Sat 8:30-12:00 Provider comes to see babies at [facility] Accepting Medicaid Must have been referred from current patients or contacted office prior to delivery [facility] ([facility]) Brown, MD; Chandler, MD; Ettefagh, MD; Grant, MD; Lester, MD; McCormick, MD; McQueen, MD; Prose, MD; Simha, MD; Stanley, MD; Stryffeler, NP; Tebben, NP [address], [place], [place] [address] [phone] Mon, Tue, Thur, Fri 8:30-5:30, Wed 9:30-5:30, Sat 8:30-12:30 Babies seen by [facility] providers Accepting Medicaid Only accepting infants of first-time parents or siblings of current patients Hospital discharge coordinator will make follow-up appointment Jack Amos [address], [place], [place]  [address] [phone]   Fax - [phone] [facility] [address], [place], [place]  [address] Phone - [phone]   Fax - [phone] Shilpa Gosrani [address], [place], [place]  [address] [phone]  [place]ortheast [place] ([address]) [facility] Bates, MD; Brassfield, MD; Cooper, Cox, MD; MD; Davis, MD; Dovico, MD; Ettefaugh, MD; Little, MD; Lowe, MD; Keiffer, MD; Melvin, MD; Sumner, MD; Williams, MD [address], [place], [place] [address] [phone] Mon-Fri 8:30-5:00 (extended evenings Mon-Thur as needed), Sat-Sun 10:00-1:00 Providers come to see babies at [facility] Accepting Medicaid for families of first-time babies and families with all children in the household age 3 and under. Must register with office prior to making appointment (M-F only). [facility] Henson, NP; Knapp, MD; Lalonde, MD; Tysinger, PA [address] [place], [place] [address] [phone] Mon-Fri 8:00-5:00 Babies seen by providers at [facility] Does NOT accept Medicaid/Commercial Insurance Only [facility] ([facility])  Artis, MD; Barnes, MD; Bratton, MD; Coccaro, MD; Lockett Gardner, MD; Kramer, MD; Marshall, MD; Netherton, MD; Poleto, MD; Skinner, MD [address] [place], [place] [address] [phone] Mon-Fri 8:30-5:30, Sat (Oct.-Mar.) 9:00-1:00 Babies seen by providers at [facility] Accepting Medicaid  [place] ([address]) [facility] Reid, MD; Warner, MD [address], [place], [place] [address] [phone] Mon-Fri 8:30-5:00, Sat 8:30-12:00 Providers come to see babies at [facility] Does NOT accept Medicaid [facility] Becker, PA; Hagler, MD; Scifres, PA; Sun, MD; Swayne, MD [address], [place], [place] [address] [phone] Mon-Fri 8:00-5:00 Babies seen by providers at [facility] Does NOT accept Medicaid Only accepting babies of parents who  are patients Please call early in hospitalization for appointment (limited availability) [facility] Clark, MD; Frye, MD; Kelleher, MD; Mack, NP; Miller, MD; O'Keller, MD; Patterson, NP; Pudlo, MD; Puzio, MD; Thomas, MD; Tucker, MD; Twiselton, MD [address], [place], [place] [address] [phone] Mon-Fri 8:00-5:00, Sat 9:00-12:00 Providers come to see babies at [facility] Does NOT accept Medicaid  [place] ([address]) [facility] providers accepting new patients: Brake, NP; Wharton, PA [address], [place], [place] [address] [phone] Mon-Fri 8:00-5:00 Babies seen by providers at [facility] Does NOT accept Medicaid Only accepting babies of parents who are patients Please call early in hospitalization for appointment (limited availability) [facility] Gay, MD; Quinlan, MD [address] [place], [place] [address] [phone] (press 1 to schedule appointment) Mon-Fri 8:00-5:00 Providers come to see babies at [facility] Does NOT accept Medicaid [facility] Mazer, MD [address] [place], [place] [address] [phone] Mon-Fri 8:30-5:00 (lunch 12:30-1:00), extended hours by appointment only Wed 5:00-6:30 Babies seen by [facility] providers Accepting Medicaid [facility] Banks, MD; Jordan, MD; Koberlein, MD [address], [place], [place] [address] [phone] Mon-Fri 8:00-5:00 Babies seen by [facility] providers Does NOT accept Medicaid [facility] Parker, MD; Hunter, MD; Wallace, DO [address] [place], [place] [address] [phone] Mon-Fri 8:00-5:00 Babies seen by [facility] providers Does NOT accept Medicaid [facility] Brandon, PA; Brecken, PA; Christy, NP; Dees, MD; DeClaire, MD; DeWeese, MD; Hansen, NP; Mills, NP; Parrish, NP; Smoot, NP; Summer, MD; Vapne,  MD [address] [place], [place] [address] [phone] Mon-Fri 8:30-5:00, Sat 10:00-1:00 Providers come to see babies at [facility] Does NOT accept Medicaid Free prenatal information session Tuesdays at 4:45pm [facility] Bouska, MD; Gordon, PA; Jeffery, PA; Weber, PA [address] [place] [address] [phone] Mon-Fri 7:30-5:30 Babies seen by [facility] providers [facility] [address], [place], [place]  [address] [phone]   Fax - [phone]  [place] ([address] & [address]) [facility] [place] [address] [place], [place] [address] [phone] Mon-Thur 8:00-6:00 Providers come to see babies at [facility] Accepting Medicaid [facility] Anderson, NP; Badger, MD; Beal, PA; Spencer, PA [address] [place], [place] [address] [phone] Mon-Thur 7:30-7:30, Fri 7:30-4:30 Babies seen by [facility] providers Accepting Medicaid [facility] Agbuya, MD; Klett, NP; Romgoolam, MD [address], [place], [place] [address] [phone] Mon-Fri 8:30-5:00, Sat 8:30-12:00 Providers come to see babies at [facility] Accepting Medicaid Must have "Meet & Greet" appointment at office prior to delivery [facility] ([facility]) McCord, MD; Wallace, MD; Wood, MD [address], [place], [place] [address] [phone] Mon-Wed 8:00-6:00, Thur-Fri 8:00-5:00, Sat 9:00-12:00 Providers come to see babies at [facility] Does NOT accept Medicaid Only accepting siblings of current patients [facility]  [address], [place], [place]  [address] [phone]   Fax - [phone] [facility] [address], [place], [place]  [address] [phone]   Fax -  [phone]  [place] ([address] & [address]) [facility] Cirigliano, DO; Matthews, DO [address] , [place] [address] [phone] Mon-Fri 7:00-5:00 Babies seen by [facility] providers Does NOT accept Medicaid [facility] Briscoe, MD; Howley, PA; Moreira, PA [address], [place], [place] [address] [phone] Mon-Fri 8:00-5:00 Babies seen by [facility] providers Accepting Medicaid [facility] Boyd, MD; Church, PA; Jones, NP; Osborn, PA [address], ,  [address] [phone] Mon-Fri 8:00-5:00 Babies seen by providers at [facility] Accepting Medicaid  [place] ([address]) [facility] [place] Wendling, DO [address] [place], [place] [address] [phone] Mon-Fri 8:00-5:00 Babies seen by [facility] providers Does NOT accept Medicaid Limited availability, please call early in hospitalization to schedule follow-up [facility] Calderon, PA; Cummings, MD; Dillard, MD; Martin, PA; Olson, MD; VanDeven, PA [address], [place], [place] [address] [phone] Mon-Fri 8:30-5:00, Sat 9:00-12:00 Babies seen by providers at [facility] Accepting Medicaid Please register online then schedule online or call office [email or web address] Hunter, NP; Kumar, MD; Martin Rogers, PA [address], [place], [place] [address] [phone] Mon-Fri 8:00-5:00 Babies seen by providers at [facility] Accepting Medicaid [facility] ([facility]) Wheeler, MD; Kristi Fleenor, NP; West, MD [address], [place], [place] [address] [phone] Mon-Fri 8:00-5:30, Sat&Sun by appointment (phones open at  8:30) Babies seen by [facility] providers Accepting Medicaid Must be a first-time baby or sibling of current patient [facility] [place]  [address], [place], [place]  [address] [phone]   Fax - [phone]  [place] ([address]) [facility] Brown, PA; Cowen, PA; Rice, MD; Helton, PA; Spry, MD [address] [place], [place] [address] [phone] Mon-Thur 8:00-7:00, Fri 8:00-5:00, Sat 8:00-12:00, Sun 9:00-12:00 Babies seen by [facility] providers Accepting Medicaid [facility] Coe-Goins, MD; Marshall, MD; Pierre-Louis, MD [address], [place], [place] [address] [phone] Mon-Thur 8:00-5:00 Babies seen by providers at [facility] Accepting Medicaid [facility] Bratton, MD; Coe-Goins, MD; Hayes, MD; Lewis, MD; List, MD; Lott, MD; Marshall, MD; Moran, MD; O'Neal, MD; Pierre-Louis, MD; Pitonzo, MD; Scholer, MD; Spangle, MD [address] [place], [place] [address] [phone] Mon-Fri 8:00-5:30, Sat (Oct.-Mar.) 9:00-1:00 Babies seen by providers at [facility] Accepting Medicaid Must fill out new patient packet, available online at [email or web address] [facility] ([facility]) Friddle, NP; Harris, NP; Kelly, NP; Logan, MD; Melvin, PA; Poth, MD; Ramadoss, MD; Stanton, NP [address], [place], [place] [address] [phone] Mon-Thur 8:00-5:30, Fri 8:00-5:00 Babies seen by providers at [facility] Accepting Medicaid  [place] ([address]) [facility] Dixon, PA; , MD; Pickard, MD; Tapia, PA [address], [place], [place] [address] [phone] Mon-Fri 8:00-5:00 Babies seen by providers at [facility] Accepting Medicaid   [place] ([address]) [facility] [place] Masneri, DO; Meyers, MD; Nelson, PA [address], [place], [place] [address] [phone] Mon-Fri 8:00-5:00 Babies seen by providers at [facility] Does NOT accept Medicaid Limited appointment availability, please call early in hospitalization  [facility] [place] Kunedd, DO; McGowen, MD [address], [place], [place] [address] [phone] Mon-Fri 8:00-5:00 Babies seen by [facility] providers Does NOT accept Medicaid [facility] [place] Cameron, MD; MacDonald, MD; Michaels, PA; Nayak, MD [address] [place] [address], [place], [place] [address] [phone] Mon-Fri 8:00-5:00 After hours clinic ([address] [place], [place] [address]) [phone] Mon-Fri 5:00-8:00, Sat 12:00-6:00, Sun 10:00-4:00 Babies seen by [facility] providers Accepting Medicaid [facility] [place] [address], [place], [place]  [address] [phone]   Fax - [phone]  [place] ([address]) [facility] [place] [facility] Andy, MD [address], [place], [place] [address] [phone] Mon-Fri 8:00-5:00 Babies seen by [facility] providers Does NOT accept Medicaid [facility] [place] ([facility] [place]) Eksir, MD [address], [place], [place] [address] [phone] Mon-Thur 8:00-7:00, Fri 8:00-5:00, Sat 8:00-12:00 Babies seen by providers at [facility] Accepting Medicaid - but does not have vaccinations in office (must be received elsewhere) Limited availability, please call early in hospitalization  [place] ([address]) [facility]  Charlene Flemming, MD [address], [place] [address] [phone]  Fax [phone]  [facility]  Kimberly [place], MD, Annamarie Streilein, PA, Carla Hampton, PA [address] [place], [place]  [address] [phone] [facility]  [address], [place], [place] [address] [phone] [address], [place], [place] [address] [phone] (West Office)  [facility] [address], [place], [place] [address] [phone] [facility] [address] [place], [place], [place] [address] [phone] [facility] [address], [place], [place] [address] [phone] [facility] [address], [place], [place] [address] [phone] [facility] [address], [place], [place] [address] [phone] [facility] [address], [place], [place] [address] [phone] [facility]  [address], [place], [place] [address] [phone] Dr[facility] [address] [place], [place], [place] [address] [phone] [facility] [address], [place], [place] [address] [phone] [facility] [address], [place],  [address] [phone]  

## 2022-05-26 NOTE — Telephone Encounter (Signed)
No follow-up call needed. Patient seen in the office today. Erline Levine, RN, 05/26/22, [phone]

## 2022-05-26 NOTE — Progress Notes (Signed)
Patient presents to office today for wound check following repeat c-section on 9/13. She reports doing well since then but thinks she might have "a little bit of postpartum". Discussed [facility] services at the office with patient and how to schedule an appt. Wound vac was in place. Wound vac removed. Incision appears clean, dry & intact. Appears to be healing well. Wound care and signs & symptoms of infection reviewed with patient. Patient will return for pp visit.   Koren Bound RN BSN 05/26/22

## 2022-05-28 DIAGNOSIS — F53 Postpartum depression: Secondary | ICD-10-CM | POA: Insufficient documentation

## 2022-05-31 DIAGNOSIS — E669 Obesity, unspecified: Secondary | ICD-10-CM | POA: Insufficient documentation

## 2022-05-31 NOTE — BH Specialist Note (Unsigned)
Pt did not arrive to video visit and did not answer the phone; Left HIPPA-compliant message to call back Macguire Holsinger from [facility] at  [phone] (Adarius Tigges's office).  ?; left MyChart message for patient.  ? ?

## 2022-06-01 ENCOUNTER — Ambulatory Visit: Payer: Medicaid Other | Admitting: Clinical

## 2022-06-01 DIAGNOSIS — Z91199 Patient's noncompliance with other medical treatment and regimen due to unspecified reason: Secondary | ICD-10-CM

## 2022-06-30 ENCOUNTER — Encounter: Payer: Self-pay | Admitting: Certified Nurse Midwife

## 2022-06-30 ENCOUNTER — Ambulatory Visit (INDEPENDENT_AMBULATORY_CARE_PROVIDER_SITE_OTHER): Payer: Medicaid Other | Admitting: Certified Nurse Midwife

## 2022-06-30 ENCOUNTER — Other Ambulatory Visit: Payer: Self-pay | Admitting: General Practice

## 2022-06-30 ENCOUNTER — Other Ambulatory Visit: Payer: Medicaid Other

## 2022-06-30 ENCOUNTER — Other Ambulatory Visit: Payer: Self-pay

## 2022-06-30 DIAGNOSIS — Z98891 History of uterine scar from previous surgery: Secondary | ICD-10-CM

## 2022-06-30 DIAGNOSIS — O24419 Gestational diabetes mellitus in pregnancy, unspecified control: Secondary | ICD-10-CM

## 2022-06-30 DIAGNOSIS — Z8632 Personal history of gestational diabetes: Secondary | ICD-10-CM | POA: Diagnosis not present

## 2022-06-30 NOTE — Progress Notes (Signed)
Postpartum Visit Note  Anaisha Mago is a 26 y.o. G10P2002 female who presents for a postpartum visit. She is 6 weeks postpartum following a repeat cesarean section.  I have fully reviewed the prenatal and intrapartum course. The delivery was at [redacted]w[redacted]d gestational weeks.  Anesthesia: spinal. Postpartum course has been uncomplicated. Baby is doing well. Baby is feeding by bottle - Enfamil Sensitive . Bleeding no bleeding. Bowel function is normal. Bladder function is normal. Patient is sexually active. Contraception method is none. Postpartum depression screening: negative.   Upstream - 06/30/22 2027       Pregnancy Intention Screening   Does the patient want to become pregnant in the next year? No    Does the patient's partner want to become pregnant in the next year? Unsure    Would the patient like to discuss contraceptive options today? No      Contraception Wrap Up   Current Method Abstinence    End Method Unknown/Not Reported    Contraception Counseling Provided No    How was the end contraceptive method provided? N/A            The pregnancy intention screening data noted above was reviewed. Potential methods of contraception were discussed. The patient elected to proceed with Unknown/Not Reported - does not desire hormonal birth control.   Edinburgh Postnatal Depression Scale - 06/30/22 0956       Edinburgh Postnatal Depression Scale:  In the Past 7 Days   I have been able to laugh and see the funny side of things. 0    I have looked forward with enjoyment to things. 0    I have blamed myself unnecessarily when things went wrong. 1    I have been anxious or worried for no good reason. 0    I have felt scared or panicky for no good reason. 0    Things have been getting on top of me. 1    I have been so unhappy that I have had difficulty sleeping. 0    I have felt sad or miserable. 0    I have been so unhappy that I have been crying. 0    The thought of harming myself  has occurred to me. 0    Edinburgh Postnatal Depression Scale Total 2            Health Maintenance Due  Topic Date Due   FOOT EXAM  Never done   OPHTHALMOLOGY EXAM  Never done   HPV VACCINES (1 - 2-dose series) Never done   Diabetic kidney evaluation - Urine ACR  Never done   PAP-Cervical Cytology Screening  Never done   PAP SMEAR-Modifier  Never done   COVID-19 Vaccine (2 - Moderna series) 08/15/2020   INFLUENZA VACCINE  Never done   The following portions of the patient's history were reviewed and updated as appropriate: allergies, current medications, past family history, past medical history, past social history, past surgical history, and problem list.  Review of Systems Pertinent items noted in HPI and remainder of comprehensive ROS otherwise negative.  Objective:  BP 126/60   Pulse 60   Wt (!) 312 lb (141.5 kg)   Breastfeeding No   BMI 48.87 kg/m    Constitutional: Alert, oriented female in no physical distress.  HEENT: PERRLA Skin: normal color and turgor, no rash Cardiovascular: normal rate & rhythm, warm and well perfused Respiratory: normal effort, no problems with respiration noted GI: Abd soft, non-tender MS: Extremities nontender, no  edema, normal ROM Neurologic: Alert and oriented x 4.  GU: no CVA tenderness Pelvic: exam deferred, will come back in a month for her annual Breasts: exam deferred Incision: well-approximated without exudate, redness or edema.  Assessment:  1. Postpartum care and examination - Doing well   2. History of gestational diabetes - Completed 2hr GTT today  3. History of 2 cesarean sections - Healing well without complication  Plan:   Essential components of care per ACOG recommendations:  1.  Mood and well being: Patient with negative depression screening today. Reviewed local resources for support.  - Patient tobacco use? No.   - hx of drug use? No.    2. Infant care and feeding:  -Patient currently breastmilk  feeding? No.  -Social determinants of health (SDOH) reviewed in EPIC. No concerns  3. Sexuality, contraception and birth spacing - Patient does not want a pregnancy in the next year.  Desired family size is 2 children.  - Reviewed reproductive life planning. Reviewed contraceptive methods based on pt preferences and effectiveness.  Patient desired No Method - Other Reason today.   - Discussed birth spacing of 18 months  4. Sleep and fatigue -Encouraged family/partner/community support of 4 hrs of uninterrupted sleep to help with mood and fatigue  5. Physical Recovery  - Discussed patients delivery and complications. She describes her labor as good. - Patient had a C-section repeat; no problems after delivery. - Patient has urinary incontinence? No. - Patient is safe to resume physical and sexual activity  6.  Health Maintenance - HM due items addressed Yes - Last pap smear unknown. Pap smear not done at today's visit. Will return for annual exam. -Breast Cancer screening indicated? No.   7. Chronic Disease/Pregnancy Condition follow up: Gestational Diabetes - PCP follow up as needed  Bernerd Limbo, CNM [facility] [employer]

## 2022-07-01 LAB — GLUCOSE TOLERANCE, 2 HOURS
Glucose, 2 hour: 91 mg/dL (ref 70–139)
Glucose, GTT - Fasting: 93 mg/dL (ref 70–99)

## 2022-08-11 ENCOUNTER — Ambulatory Visit: Payer: Medicaid Other | Admitting: Certified Nurse Midwife

## 2022-08-11 DIAGNOSIS — Z01419 Encounter for gynecological examination (general) (routine) without abnormal findings: Secondary | ICD-10-CM

## 2022-08-11 NOTE — Progress Notes (Signed)
Pt did not come to appt

## 2023-03-11 ENCOUNTER — Encounter (HOSPITAL_COMMUNITY): Payer: Self-pay | Admitting: Family Medicine

## 2023-03-11 ENCOUNTER — Inpatient Hospital Stay (HOSPITAL_COMMUNITY): Payer: Medicaid Other

## 2023-03-11 ENCOUNTER — Other Ambulatory Visit: Payer: Self-pay

## 2023-03-11 ENCOUNTER — Inpatient Hospital Stay (HOSPITAL_COMMUNITY)
Admission: AD | Admit: 2023-03-11 | Discharge: 2023-03-11 | Disposition: A | Discharge status: Home / Self Care | Payer: Medicaid Other | Attending: Obstetrics and Gynecology | Admitting: Obstetrics and Gynecology

## 2023-03-11 DIAGNOSIS — R109 Unspecified abdominal pain: Secondary | ICD-10-CM | POA: Diagnosis not present

## 2023-03-11 DIAGNOSIS — O418X10 Other specified disorders of amniotic fluid and membranes, first trimester, not applicable or unspecified: Secondary | ICD-10-CM | POA: Diagnosis not present

## 2023-03-11 DIAGNOSIS — K5900 Constipation, unspecified: Secondary | ICD-10-CM | POA: Insufficient documentation

## 2023-03-11 DIAGNOSIS — O3411 Maternal care for benign tumor of corpus uteri, first trimester: Secondary | ICD-10-CM | POA: Insufficient documentation

## 2023-03-11 DIAGNOSIS — D259 Leiomyoma of uterus, unspecified: Secondary | ICD-10-CM | POA: Insufficient documentation

## 2023-03-11 DIAGNOSIS — N939 Abnormal uterine and vaginal bleeding, unspecified: Secondary | ICD-10-CM

## 2023-03-11 DIAGNOSIS — Z3A08 8 weeks gestation of pregnancy: Secondary | ICD-10-CM | POA: Diagnosis not present

## 2023-03-11 DIAGNOSIS — O99611 Diseases of the digestive system complicating pregnancy, first trimester: Secondary | ICD-10-CM | POA: Insufficient documentation

## 2023-03-11 DIAGNOSIS — O208 Other hemorrhage in early pregnancy: Secondary | ICD-10-CM | POA: Diagnosis not present

## 2023-03-11 DIAGNOSIS — R1030 Lower abdominal pain, unspecified: Secondary | ICD-10-CM | POA: Insufficient documentation

## 2023-03-11 DIAGNOSIS — O26891 Other specified pregnancy related conditions, first trimester: Secondary | ICD-10-CM | POA: Insufficient documentation

## 2023-03-11 DIAGNOSIS — O468X1 Other antepartum hemorrhage, first trimester: Secondary | ICD-10-CM

## 2023-03-11 LAB — CBC
HCT: 33.6 % — ABNORMAL LOW (ref 36.0–46.0)
Hemoglobin: 11 g/dL — ABNORMAL LOW (ref 12.0–15.0)
MCH: 29.7 pg (ref 26.0–34.0)
MCHC: 32.7 g/dL (ref 30.0–36.0)
MCV: 90.8 fL (ref 80.0–100.0)
Platelets: 305 10*3/uL (ref 150–400)
RBC: 3.7 MIL/uL — ABNORMAL LOW (ref 3.87–5.11)
RDW: 13.3 % (ref 11.5–15.5)
WBC: 5.7 10*3/uL (ref 4.0–10.5)
nRBC: 0 % (ref 0.0–0.2)

## 2023-03-11 LAB — URINALYSIS, ROUTINE W REFLEX MICROSCOPIC
Bilirubin Urine: NEGATIVE
Glucose, UA: NEGATIVE mg/dL
Ketones, ur: NEGATIVE mg/dL
Leukocytes,Ua: NEGATIVE
Nitrite: NEGATIVE
Protein, ur: NEGATIVE mg/dL
Specific Gravity, Urine: >1.03 — ABNORMAL HIGH (ref 1.005–1.030)
pH: 6 (ref 5.0–8.0)

## 2023-03-11 LAB — URINALYSIS, MICROSCOPIC (REFLEX)

## 2023-03-11 LAB — WET PREP, GENITAL
Clue Cells Wet Prep HPF POC: NONE SEEN
Sperm: NONE SEEN
Trich, Wet Prep: NONE SEEN
WBC, Wet Prep HPF POC: <10 (ref <10)
Yeast Wet Prep HPF POC: NONE SEEN

## 2023-03-11 LAB — HCG, QUANTITATIVE, PREGNANCY: hCG, Beta Chain, Quant, S: 78842 m[IU]/mL — ABNORMAL HIGH (ref <5)

## 2023-03-11 LAB — POCT PREGNANCY, URINE: Preg Test, Ur: POSITIVE — AB

## 2023-03-11 NOTE — Progress Notes (Signed)
Spoke with Morrie Sheldon from social services and updated her on patients situation. Did not report a time frame of coming to see the patient.

## 2023-03-11 NOTE — Progress Notes (Signed)
Patient requesting to leave without seeing social work first. Restaurant manager, [employer] notified.

## 2023-03-11 NOTE — MAU Note (Signed)
Joanna Buchanan is a 27 y.o. at Unknown here in MAU reporting: she began having abdominal cramping last night and noted VB this morning.  States noted VB with wiping this morning. LMP: 01/11/2023 Onset of complaint: yesterday Pain score: 7 Vitals:   03/11/23 0918  BP: 130/76  Pulse: 95  Resp: 18  Temp: 98.2 F (36.8 C)  SpO2: 100%     FHT:NA Lab orders placed from triage:   UPT

## 2023-03-11 NOTE — MAU Provider Note (Signed)
History     CSN: [id number]  Arrival date and time: 03/11/23 8295   None     Chief Complaint  Patient presents with   Vaginal Bleeding   Cramping   HPI Joanna Buchanan is a 27 y.o. G3P2002 at unknown gestation who presents to MAU for vaginal bleeding and abdominal cramping. She reports around 0800-0900 she went to the bathroom and passed a small blood clot. Since she passed the clot she just "has dabs of blood here and there". She reports mild lower abdominal cramping that is constant however has not taken anything for it. She denies vaginal itching, odor, or urinary s/s. She reports some constipation, last BM was 2 days ago.    OB History     Gravida  2   Para  2   Term  2   Preterm  0   AB  0   Living  2      SAB  0   IAB  0   Ectopic  0   Multiple  0   Live Births  2           Past Medical History:  Diagnosis Date   GERD (gastroesophageal reflux disease)    Gestational diabetes    Gestational diabetes mellitus (GDM) affecting pregnancy, antepartum 03/30/2022   Current Diabetic Medications:  None  [ ]  Aspirin 81 mg daily after 12 weeks (? A2/B GDM)  Required Referrals for A1GDM or A2GDM: [ ]  Diabetes Education and Testing Supplies [ ]  Nutrition Cousult  For A2/B GDM or higher classes of DM [ ]  Diabetes Education and Testing Supplies [ ]  Nutrition Counsult [ ]  Fetal ECHO after 20 weeks  [ ]  Eye exam for retina evaluation   Baseline and surveillance labs (   Herpes, genital    Medical history non-contributory     Past Surgical History:  Procedure Laterality Date   CESAREAN SECTION     CESAREAN SECTION N/A 05/19/2022   Procedure: CESAREAN SECTION;  Surgeon: Federico Flake, MD;  [facility];  Service: Obstetrics;  Laterality: N/A;   TONSILLECTOMY      Family History  Problem Relation Age of Onset   Diabetes Mother    Hypertension Mother    Asthma Mother    Multiple sclerosis Mother    Cancer Maternal Aunt    Diabetes Maternal  Grandmother    Birth defects Neg Hx    Heart disease Neg Hx    Stroke Neg Hx     Social History   Tobacco Use   Smoking status: Never   Smokeless tobacco: Never  Vaping Use   Vaping Use: Never used  Substance Use Topics   Alcohol use: Never   Drug use: Yes    Types: Marijuana    Comment: past week    Allergies:  Allergies  Allergen Reactions   Other Anaphylaxis, Shortness Of Breath and Swelling    Avocado, watermelon, cherries, bananas, oranges cause throat swelling   Shellfish Allergy Itching    Medications Prior to Admission  Medication Sig Dispense Refill Last Dose   ibuprofen (ADVIL) 600 MG tablet Take 1 tablet (600 mg total) by mouth every 6 (six) hours as needed. (Patient not taking: Reported on 05/26/2022) 30 tablet 0    Review of Systems  Constitutional: Negative.   Gastrointestinal:  Positive for abdominal pain and constipation.  Genitourinary:  Positive for vaginal bleeding.  All other systems reviewed and are negative.  Physical Exam   Blood  pressure 130/76, pulse 95, temperature 98.2 F (36.8 C), temperature source Oral, resp. rate 18, height 5\' 6"  (1.676 m), weight (!) 151.7 kg, last menstrual period 01/11/2023, SpO2 100 %, not currently breastfeeding.  Physical Exam Vitals and nursing note reviewed. Exam conducted with a chaperone present.  Eyes:     Extraocular Movements: Extraocular movements intact.     Pupils: Pupils are equal, round, and reactive to light.  Cardiovascular:     Rate and Rhythm: Normal rate.  Pulmonary:     Effort: Pulmonary effort is normal. No respiratory distress.  Abdominal:     Palpations: Abdomen is soft.     Tenderness: There is no abdominal tenderness.  Genitourinary:    Comments: Patient self swabbed Musculoskeletal:        General: Normal range of motion.     Cervical back: Normal range of motion.  Skin:    General: Skin is warm and dry.  Neurological:     General: No focal deficit present.     Mental Status:  She is alert and oriented to person, place, and time.  Psychiatric:        Mood and Affect: Mood normal.        Behavior: Behavior normal.    Results for orders placed or performed during the hospital encounter of 03/11/23 (from the past 24 hour(s))  Pregnancy, urine POC     Status: Abnormal   Collection Time: 03/11/23  9:48 AM  Result Value Ref Range   Preg Test, Ur POSITIVE (A) NEGATIVE  Urinalysis, Routine w reflex microscopic -Urine, Clean Catch     Status: Abnormal   Collection Time: 03/11/23 10:06 AM  Result Value Ref Range   Color, Urine YELLOW YELLOW   APPearance CLEAR CLEAR   Specific Gravity, Urine >1.030 (H) 1.005 - 1.030   pH 6.0 5.0 - 8.0   Glucose, UA NEGATIVE NEGATIVE mg/dL   Hgb urine dipstick LARGE (A) NEGATIVE   Bilirubin Urine NEGATIVE NEGATIVE   Ketones, ur NEGATIVE NEGATIVE mg/dL   Protein, ur NEGATIVE NEGATIVE mg/dL   Nitrite NEGATIVE NEGATIVE   Leukocytes,Ua NEGATIVE NEGATIVE  Urinalysis, Microscopic (reflex)     Status: Abnormal   Collection Time: 03/11/23 10:06 AM  Result Value Ref Range   RBC / HPF 0-5 0 - 5 RBC/hpf   WBC, UA 0-5 0 - 5 WBC/hpf   Bacteria, UA RARE (A) NONE SEEN   Squamous Epithelial / HPF 11-20 0 - 5 /HPF   Mucus PRESENT   CBC     Status: Abnormal   Collection Time: 03/11/23 10:11 AM  Result Value Ref Range   WBC 5.7 4.0 - 10.5 K/uL   RBC 3.70 (L) 3.87 - 5.11 MIL/uL   Hemoglobin 11.0 (L) 12.0 - 15.0 g/dL   HCT 47.8 (L) 29.5 - 62.1 %   MCV 90.8 80.0 - 100.0 fL   MCH 29.7 26.0 - 34.0 pg   MCHC 32.7 30.0 - 36.0 g/dL   RDW 30.8 65.7 - 84.6 %   Platelets 305 150 - 400 K/uL   nRBC 0.0 0.0 - 0.2 %  hCG, quantitative, pregnancy     Status: Abnormal   Collection Time: 03/11/23 10:11 AM  Result Value Ref Range   hCG, Beta Chain, Quant, S 96,295 (H) <5 mIU/mL  Wet prep, genital     Status: None   Collection Time: 03/11/23 11:15 AM   Specimen: Vaginal  Result Value Ref Range   Yeast Wet Prep HPF POC NONE SEEN NONE  SEEN   Trich, Wet  Prep NONE SEEN NONE SEEN   Clue Cells Wet Prep HPF POC NONE SEEN NONE SEEN   WBC, Wet Prep HPF POC <10 <10   Sperm NONE SEEN    US OB LESS THAN 14 WEEKS WITH OB TRANSVAGINAL  Result Date: 03/11/2023 CLINICAL DATA:  Pregnancy.  Bleeding EXAM: OBSTETRIC <14 WK [place] TECHNIQUE: Both transabdominal and transvaginal ultrasound examinations were performed for complete evaluation of the gestation as well as the maternal uterus, adnexal regions, and pelvic cul-de-sac. Transvaginal technique was performed to assess early pregnancy. COMPARISON:  None Available. FINDINGS: Intrauterine gestational sac: Single Yolk sac:  Visualized. Embryo:  Visualized. Cardiac Activity: Visualized. Heart Rate: 177 bpm CRL:  18.8 mm   8 w   3 d                  [place] EDC: 10/18/2023 Subchorionic hemorrhage:  Small Maternal uterus/adnexae: Neither maternal ovary is well seen with overlapping bowel gas and soft tissue. There is a uterine fibroid identified anterior towards the left measuring 3.6 x 2.0 x 2.4 cm. IMPRESSION: Single live intrauterine pregnancy of 8 weeks and 3 days with positive fetal heart motion. Poor visualization of the ovaries. No free fluid in the maternal pelvis. Uterine fibroid anteriorly. Electronically Signed   By: Karen Kays M.D.   On: 03/11/2023 11:03     MAU Course  Procedures  MDM UA CBC, HCG Wet prep, GC/CT [place] SW consult  Labs reassuring. US shows SIUP with FHR present. Small subchorionic hemorrhage noted.  Patient noted to CNM and RN that she is homeless and has been living out of her car for the past 2 weeks. SW consult, see separate note.   Assessment and Plan   1. Abdominal cramping   2. Vaginal bleeding   3. [redacted] weeks gestation of pregnancy   4. Subchorionic hematoma in first trimester, single or unspecified fetus    - Discharge home in stable condition - Pelvic rest. Return precautions given - Establish [facility]  Brand Males, CNM 03/11/2023, 10:03 AM

## 2023-03-11 NOTE — Progress Notes (Signed)
Patient reports being homeless and living in her car with her other children for 2 weeks now. Her mother has her other children while she is in MAU and her partner Minerva Areola is at the bedside.

## 2023-03-14 LAB — GC/CHLAMYDIA PROBE AMP (~~LOC~~) NOT AT ARMC
Chlamydia: NEGATIVE
Comment: NEGATIVE
Comment: NORMAL
Neisseria Gonorrhea: NEGATIVE

## 2023-04-13 ENCOUNTER — Telehealth (INDEPENDENT_AMBULATORY_CARE_PROVIDER_SITE_OTHER): Payer: 59 | Admitting: *Deleted

## 2023-04-13 DIAGNOSIS — O99210 Obesity complicating pregnancy, unspecified trimester: Secondary | ICD-10-CM

## 2023-04-13 DIAGNOSIS — O0990 Supervision of high risk pregnancy, unspecified, unspecified trimester: Secondary | ICD-10-CM

## 2023-04-13 DIAGNOSIS — Z8632 Personal history of gestational diabetes: Secondary | ICD-10-CM

## 2023-04-13 DIAGNOSIS — Z8619 Personal history of other infectious and parasitic diseases: Secondary | ICD-10-CM

## 2023-04-13 DIAGNOSIS — Z98891 History of uterine scar from previous surgery: Secondary | ICD-10-CM | POA: Insufficient documentation

## 2023-04-13 DIAGNOSIS — O24419 Gestational diabetes mellitus in pregnancy, unspecified control: Secondary | ICD-10-CM

## 2023-04-13 DIAGNOSIS — O0991 Supervision of high risk pregnancy, unspecified, first trimester: Secondary | ICD-10-CM

## 2023-04-13 DIAGNOSIS — Z3A13 13 weeks gestation of pregnancy: Secondary | ICD-10-CM

## 2023-04-13 DIAGNOSIS — O99211 Obesity complicating pregnancy, first trimester: Secondary | ICD-10-CM

## 2023-04-13 HISTORY — DX: Gestational diabetes mellitus in pregnancy, unspecified control: O24.419

## 2023-04-13 HISTORY — DX: Supervision of high risk pregnancy, unspecified, unspecified trimester: O09.90

## 2023-04-13 NOTE — Progress Notes (Signed)
New OB Intake  I connected with Joanna Buchanan  on 04/13/23 at 1:30 by MyChart Video Visit and verified that I am speaking with the correct person using two identifiers. Nurse is located at [facility] and pt is located at home.  I discussed the limitations, risks, security and privacy concerns of performing an evaluation and management service by telephone and the availability of in person appointments. I also discussed with the patient that there may be a patient responsible charge related to this service. The patient expressed understanding and agreed to proceed.  I explained I am completing New OB Intake today. We discussed EDD of 10/18/2023, by Ultrasound. Pt is G3P2002. I reviewed her allergies, medications and Medical/Surgical/OB history.    Patient Active Problem List   Diagnosis Date Noted   Obesity in pregnancy 04/13/2023   History of herpes genitalis 04/13/2023   History of C-section 04/13/2023   History of gestational diabetes mellitus (GDM) 04/13/2023   Supervision of high risk pregnancy, antepartum 04/13/2023   History of 2 cesarean sections 04/21/2022   GBS bacteriuria 03/10/2022   Herpes, genital    Housing situation unstable 02/05/2022    Concerns addressed today  Delivery Plans Plans to deliver at [facility]. Discussed the nature of our practice with multiple providers including residents and students. Due to the size of the practice, the delivering provider may not be the same as those providing prenatal care.   Waterbirth Patient is not a candidate  for waterbith due to hx 2 C/S  MyChart/Babyscripts MyChart access verified. I explained pt will have some visits in office and some virtually. Babyscripts instructions given and order placed.   Blood Pressure Cuff/Weight Scale Patient has private insurance; instructed to purchase blood pressure cuff and bring to first prenatal appt. Explained after first prenatal appt pt will check weekly and document in Babyscripts.  Patient does not have weight scale; patient may purchase if they desire to track weight weekly in Babyscripts.  Anatomy US Explained first scheduled [place] will be around 19 weeks. Anatomy US scheduled for 05/24/23 at 1230.  Is patient a CenteringPregnancy candidate?  Accepted  Is patient a Mom+Baby Combined Care candidate?  Not a candidate    Interested in [place]? Not a candidate -repeat c/s  Is patient a candidate for Babyscripts Optimization? No - hx 2 c/section, obesity, hx GDM  First visit review I reviewed new OB appt with patient. Explained pt will be seen by Dr. Shawnie Pons  at first visit. Discussed Joanna Buchanan genetic screening with patient. She would like  Panorama  drawn. She has had Horizon.drawn in past. Routine prenatal labs needed to be draw. Since she is [redacted]w[redacted]d and new ob not until  04/26/23 offered lab visit to get labs drawn prior to new ob.   Last Pap No results found for: "DIAGPAP"  Nancy Fetter 04/13/2023  2:10 PM

## 2023-04-18 ENCOUNTER — Other Ambulatory Visit: Payer: 59

## 2023-04-19 ENCOUNTER — Other Ambulatory Visit: Payer: Self-pay

## 2023-04-19 ENCOUNTER — Other Ambulatory Visit: Payer: 59

## 2023-04-19 DIAGNOSIS — O0990 Supervision of high risk pregnancy, unspecified, unspecified trimester: Secondary | ICD-10-CM

## 2023-04-21 ENCOUNTER — Encounter: Payer: Medicaid Other | Admitting: Obstetrics and Gynecology

## 2023-04-26 ENCOUNTER — Other Ambulatory Visit: Payer: Self-pay

## 2023-04-26 ENCOUNTER — Ambulatory Visit (INDEPENDENT_AMBULATORY_CARE_PROVIDER_SITE_OTHER): Payer: 59 | Admitting: Family Medicine

## 2023-04-26 ENCOUNTER — Other Ambulatory Visit (HOSPITAL_COMMUNITY)
Admission: RE | Admit: 2023-04-26 | Discharge: 2023-04-26 | Disposition: A | Discharge status: Home / Self Care | Payer: 59 | Source: Ambulatory Visit | Attending: Family Medicine | Admitting: Family Medicine

## 2023-04-26 VITALS — BP 112/71 | HR 106 | Wt 333.6 lb

## 2023-04-26 DIAGNOSIS — Z98891 History of uterine scar from previous surgery: Secondary | ICD-10-CM | POA: Diagnosis not present

## 2023-04-26 DIAGNOSIS — Z8619 Personal history of other infectious and parasitic diseases: Secondary | ICD-10-CM | POA: Diagnosis not present

## 2023-04-26 DIAGNOSIS — Z8632 Personal history of gestational diabetes: Secondary | ICD-10-CM

## 2023-04-26 DIAGNOSIS — Z124 Encounter for screening for malignant neoplasm of cervix: Secondary | ICD-10-CM

## 2023-04-26 DIAGNOSIS — Z719 Counseling, unspecified: Secondary | ICD-10-CM | POA: Insufficient documentation

## 2023-04-26 DIAGNOSIS — O99322 Drug use complicating pregnancy, second trimester: Secondary | ICD-10-CM | POA: Diagnosis not present

## 2023-04-26 DIAGNOSIS — Z148 Genetic carrier of other disease: Secondary | ICD-10-CM | POA: Diagnosis not present

## 2023-04-26 DIAGNOSIS — O0990 Supervision of high risk pregnancy, unspecified, unspecified trimester: Secondary | ICD-10-CM

## 2023-04-26 DIAGNOSIS — R7303 Prediabetes: Secondary | ICD-10-CM

## 2023-04-26 DIAGNOSIS — O0992 Supervision of high risk pregnancy, unspecified, second trimester: Secondary | ICD-10-CM

## 2023-04-26 DIAGNOSIS — Z3A15 15 weeks gestation of pregnancy: Secondary | ICD-10-CM | POA: Diagnosis not present

## 2023-04-26 DIAGNOSIS — O99320 Drug use complicating pregnancy, unspecified trimester: Secondary | ICD-10-CM | POA: Insufficient documentation

## 2023-04-26 MED ORDER — ASPIRIN 81 MG PO TBEC: 81.0000 mg | DELAYED_RELEASE_TABLET | Freq: Every day | ORAL | 3 refills | Status: DC

## 2023-04-26 NOTE — Progress Notes (Signed)
Subjective:   Joanna Buchanan is a 27 y.o. G3P2002 at [redacted]w[redacted]d by LMP, early ultrasound being seen today for her first obstetrical visit.  Her obstetrical history is significant for obesity and previous C-sections, h/o GDM . Patient does intend to breast feed. Pregnancy history fully reviewed.  Patient reports no complaints.  HISTORY: OB History  Gravida Para Term Preterm AB Living  3 2 2  0 0 2  SAB IAB Ectopic Multiple Live Births  0 0 0 0 2    # Outcome Date GA Lbr Len/2nd Weight Sex Type Anes PTL Lv  3 Current           2 Term 05/19/22 [redacted]w[redacted]d  6 lb 9.5 oz (2.99 kg) F CS-Vac Spinal  LIV     Birth Comments: Early term female with intermittent grunting; improved after PPV/CPAP.GDM     Name: RAHIL, OGNIBENE     Apgar1: 7  Apgar5: 8  1 Term 2019 [redacted]w[redacted]d  6 lb (2.722 kg)  CS-LTranv EPI  LIV     Birth Comments: for HSV outbreak   Past Medical History:  Diagnosis Date   GERD (gastroesophageal reflux disease)    Gestational diabetes    Gestational diabetes mellitus (GDM) affecting pregnancy, antepartum 03/30/2022   Current Diabetic Medications:  None  [ ]  Aspirin 81 mg daily after 12 weeks (? A2/B GDM)  Required Referrals for A1GDM or A2GDM: [ ]  Diabetes Education and Testing Supplies [ ]  Nutrition Cousult  For A2/B GDM or higher classes of DM [ ]  Diabetes Education and Testing Supplies [ ]  Nutrition Counsult [ ]  Fetal ECHO after 20 weeks  [ ]  Eye exam for retina evaluation   Baseline and surveillance labs (   Herpes, genital    Medical history non-contributory    Past Surgical History:  Procedure Laterality Date   CESAREAN SECTION     CESAREAN SECTION N/A 05/19/2022   Procedure: CESAREAN SECTION;  Surgeon: Federico Flake, MD;  Location: [facility] LD [facility]S;  Service: Obstetrics;  Laterality: N/A;   TONSILLECTOMY     Family History  Problem Relation Age of Onset   Diabetes Mother    Hypertension Mother    Asthma Mother    Multiple sclerosis Mother    Cancer Maternal Aunt     Diabetes Maternal Grandmother    Birth defects Neg Hx    Heart disease Neg Hx    Stroke Neg Hx    Social History   Tobacco Use   Smoking status: Never   Smokeless tobacco: Never  Vaping Use   Vaping status: Never Used  Substance Use Topics   Alcohol use: Never   Drug use: Yes    Types: Marijuana    Comment: once a day for appetite   Allergies  Allergen Reactions   Other Anaphylaxis, Shortness Of Breath and Swelling    Avocado, watermelon, cherries, bananas, oranges cause throat swelling   Shellfish Allergy Itching   Current Outpatient Medications on File Prior to Visit  Medication Sig Dispense Refill   Prenatal Vit-Fe Fumarate-FA (PRENATAL VITAMIN PO) Take 1 tablet by mouth daily at 6 (six) AM.     No current facility-administered medications on file prior to visit.     Exam   Vitals:   04/26/23 0929  BP: 112/71  Pulse: (!) 106  Weight: (!) 333 lb 9.6 oz (151.3 kg)      Uterus:     Pelvic Exam: Perineum: no hemorrhoids, normal perineum   Vulva: normal  external genitalia, no lesions   Vagina:  normal mucosa, normal discharge   Cervix: no lesions and normal, pap smear done.    Adnexa: normal adnexa and no mass, fullness, tenderness   Bony Pelvis: average  System: General: well-developed, well-nourished female in no acute distress   Skin: normal coloration and turgor, no rashes   Neurologic: oriented, normal, negative, normal mood   Extremities: normal strength, tone, and muscle mass, ROM of all joints is normal   HEENT PERRLA, extraocular movement intact and sclera clear, anicteric   Mouth/Teeth mucous membranes moist, pharynx normal without lesions and dental hygiene good   Neck supple and no masses   Cardiovascular: regular rate and rhythm   Respiratory:  no respiratory distress, normal breath sounds   Abdomen: soft, non-tender; bowel sounds normal; no masses,  no organomegaly     Assessment:   Pregnancy: [id number] Patient Active Problem List    Diagnosis Date Noted   Screening for cervical cancer 04/26/2023   Drug use complicating pregnancy 04/26/2023   Obesity in pregnancy 04/13/2023   History of herpes genitalis 04/13/2023   History of C-section 04/13/2023   History of gestational diabetes mellitus (GDM) 04/13/2023   Supervision of high risk pregnancy, antepartum 04/13/2023     Plan:  1. Supervision of high risk pregnancy, antepartum New OB labs today - CBC/D/Plt+RPR+Rh+ABO+RubIgG... - Culture, OB Urine - AFP, Serum, Open Spina Bifida - PANORAMA PRENATAL TEST  2. History of herpes genitalis Will need ppx at 34-36 weeks  3. History of gestational diabetes mellitus (GDM) Baseline labs - Hemoglobin A1c - Comprehensive metabolic panel - TSH - Protein / creatinine ratio, urine - aspirin EC 81 MG tablet; Take 1 tablet (81 mg total) by mouth daily.  Dispense: 90 tablet; Refill: 3  4. History of C-section X 2 - unsure about method of delivery #1 HSV outbreak #2 Elective RCS  5. Screening for cervical cancer - Cytology - PAP( [place])  6. Drug use affecting pregnancy in second trimester Marijuana use  7. Increased risk of being a Carrier for SMA Offered partner testing  Initial labs drawn. Continue prenatal vitamins. Genetic Screening discussed, NIPS: ordered. Ultrasound discussed; fetal anatomic survey: ordered. Problem list reviewed and updated. The nature of [facility] with multiple MDs and other Advanced Practice Providers was explained to patient; also emphasized that residents, students are part of our team. Routine obstetric precautions reviewed. Return in 4 weeks (on 05/24/2023).

## 2023-04-27 LAB — CBC/D/PLT+RPR+RH+ABO+RUBIGG...
Antibody Screen: NEGATIVE
Basophils Absolute: 0 10*3/uL (ref 0.0–0.2)
Basos: 0 %
EOS (ABSOLUTE): 0.1 10*3/uL (ref 0.0–0.4)
Eos: 2 %
HCV Ab: NONREACTIVE
HIV Screen 4th Generation wRfx: NONREACTIVE
Hematocrit: 38.6 % (ref 34.0–46.6)
Hemoglobin: 12.8 g/dL (ref 11.1–15.9)
Hepatitis B Surface Ag: NEGATIVE
Immature Grans (Abs): 0 10*3/uL (ref 0.0–0.1)
Immature Granulocytes: 1 %
Lymphocytes Absolute: 2.7 10*3/uL (ref 0.7–3.1)
Lymphs: 32 %
MCH: 29.4 pg (ref 26.6–33.0)
MCHC: 33.2 g/dL (ref 31.5–35.7)
MCV: 89 fL (ref 79–97)
Monocytes Absolute: 0.5 10*3/uL (ref 0.1–0.9)
Monocytes: 6 %
Neutrophils Absolute: 4.9 10*3/uL (ref 1.4–7.0)
Neutrophils: 59 %
Platelets: 316 10*3/uL (ref 150–450)
RBC: 4.35 x10E6/uL (ref 3.77–5.28)
RDW: 12.9 % (ref 11.7–15.4)
RPR Ser Ql: NONREACTIVE
Rh Factor: POSITIVE
Rubella Antibodies, IGG: 4.85 {index} (ref >0.99)
WBC: 8.3 10*3/uL (ref 3.4–10.8)

## 2023-04-27 LAB — HCV INTERPRETATION

## 2023-04-27 LAB — CYTOLOGY - PAP
Chlamydia: NEGATIVE
Comment: NEGATIVE
Comment: NEGATIVE
Comment: NEGATIVE
Comment: NORMAL
Diagnosis: NEGATIVE
High risk HPV: NEGATIVE
Neisseria Gonorrhea: NEGATIVE
Trichomonas: NEGATIVE

## 2023-04-27 LAB — PROTEIN / CREATININE RATIO, URINE
Creatinine, Urine: 84.1 mg/dL
Protein, Ur: 18.1 mg/dL
Protein/Creat Ratio: 215 mg/g{creat} — ABNORMAL HIGH (ref 0–200)

## 2023-04-28 ENCOUNTER — Encounter: Payer: Self-pay | Admitting: *Deleted

## 2023-04-28 DIAGNOSIS — R7303 Prediabetes: Secondary | ICD-10-CM | POA: Insufficient documentation

## 2023-04-28 LAB — COMPREHENSIVE METABOLIC PANEL
ALT: 9 IU/L (ref 0–32)
AST: 11 IU/L (ref 0–40)
Albumin: 3.9 g/dL — ABNORMAL LOW (ref 4.0–5.0)
Alkaline Phosphatase: 53 IU/L (ref 44–121)
BUN/Creatinine Ratio: 13 (ref 9–23)
BUN: 8 mg/dL (ref 6–20)
Bilirubin Total: <0.2 mg/dL (ref 0.0–1.2)
CO2: 17 mmol/L — ABNORMAL LOW (ref 20–29)
Calcium: 9.1 mg/dL (ref 8.7–10.2)
Chloride: 103 mmol/L (ref 96–106)
Creatinine, Ser: 0.6 mg/dL (ref 0.57–1.00)
Globulin, Total: 2.4 g/dL (ref 1.5–4.5)
Glucose: 135 mg/dL — ABNORMAL HIGH (ref 70–99)
Potassium: 4 mmol/L (ref 3.5–5.2)
Sodium: 135 mmol/L (ref 134–144)
Total Protein: 6.3 g/dL (ref 6.0–8.5)
eGFR: 128 mL/min/{1.73_m2} (ref >59)

## 2023-04-28 LAB — AFP, SERUM, OPEN SPINA BIFIDA
AFP MoM: 0.49
AFP Value: 10.1 ng/mL
Gest. Age on Collection Date: 15 wk
Maternal Age At EDD: 25.7 a
OSBR Risk 1 IN: 10000
Test Results:: NEGATIVE
Weight: 333 [lb_av]

## 2023-04-28 LAB — CULTURE, OB URINE

## 2023-04-28 LAB — HEMOGLOBIN A1C
Est. average glucose Bld gHb Est-mCnc: 126 mg/dL
Hgb A1c MFr Bld: 6 % — ABNORMAL HIGH (ref 4.8–5.6)

## 2023-04-28 LAB — TSH: TSH: 1.41 u[IU]/mL (ref 0.450–4.500)

## 2023-04-28 LAB — URINE CULTURE, OB REFLEX

## 2023-04-29 ENCOUNTER — Other Ambulatory Visit: Payer: Self-pay

## 2023-04-29 DIAGNOSIS — R7303 Prediabetes: Secondary | ICD-10-CM

## 2023-04-29 DIAGNOSIS — O0990 Supervision of high risk pregnancy, unspecified, unspecified trimester: Secondary | ICD-10-CM

## 2023-05-02 ENCOUNTER — Encounter: Payer: Self-pay | Admitting: *Deleted

## 2023-05-02 LAB — PANORAMA PRENATAL TEST FULL PANEL:PANORAMA TEST PLUS 5 ADDITIONAL MICRODELETIONS: FETAL FRACTION: 2.3

## 2023-05-04 ENCOUNTER — Other Ambulatory Visit: Payer: 59

## 2023-05-18 ENCOUNTER — Encounter: Payer: Self-pay | Admitting: *Deleted

## 2023-05-18 ENCOUNTER — Telehealth: Payer: Self-pay | Admitting: *Deleted

## 2023-05-18 ENCOUNTER — Encounter: Payer: 59 | Admitting: Family Medicine

## 2023-05-18 NOTE — Telephone Encounter (Addendum)
Joanna Buchanan centeringpregnancy Prenatal care visit. I called and left a message she missed an appointment and I needed to schedule an appointment and will send a MyChart message for her to read and respond.  Per Dr. Alvester Morin she does need makeup ob visit rescheduled within asap , and  needs early 2 hr gtt asap. Nancy Fetter

## 2023-05-24 ENCOUNTER — Ambulatory Visit: Payer: 59 | Attending: Family Medicine

## 2023-05-24 ENCOUNTER — Encounter: Payer: Self-pay | Admitting: *Deleted

## 2023-05-24 ENCOUNTER — Ambulatory Visit: Payer: 59 | Admitting: *Deleted

## 2023-05-24 ENCOUNTER — Other Ambulatory Visit: Payer: Self-pay | Admitting: *Deleted

## 2023-05-24 VITALS — BP 123/74 | HR 90

## 2023-05-24 DIAGNOSIS — O99212 Obesity complicating pregnancy, second trimester: Secondary | ICD-10-CM

## 2023-05-24 DIAGNOSIS — E669 Obesity, unspecified: Secondary | ICD-10-CM | POA: Diagnosis not present

## 2023-05-24 DIAGNOSIS — Z8632 Personal history of gestational diabetes: Secondary | ICD-10-CM | POA: Insufficient documentation

## 2023-05-24 DIAGNOSIS — O3662X0 Maternal care for excessive fetal growth, second trimester, not applicable or unspecified: Secondary | ICD-10-CM

## 2023-05-24 DIAGNOSIS — O0990 Supervision of high risk pregnancy, unspecified, unspecified trimester: Secondary | ICD-10-CM | POA: Diagnosis not present

## 2023-05-24 DIAGNOSIS — O43192 Other malformation of placenta, second trimester: Secondary | ICD-10-CM

## 2023-05-24 DIAGNOSIS — O99210 Obesity complicating pregnancy, unspecified trimester: Secondary | ICD-10-CM | POA: Diagnosis not present

## 2023-05-24 DIAGNOSIS — Z98891 History of uterine scar from previous surgery: Secondary | ICD-10-CM | POA: Diagnosis not present

## 2023-05-24 DIAGNOSIS — O34219 Maternal care for unspecified type scar from previous cesarean delivery: Secondary | ICD-10-CM | POA: Diagnosis not present

## 2023-05-24 DIAGNOSIS — Z3A19 19 weeks gestation of pregnancy: Secondary | ICD-10-CM | POA: Diagnosis not present

## 2023-05-24 DIAGNOSIS — Z148 Genetic carrier of other disease: Secondary | ICD-10-CM | POA: Diagnosis not present

## 2023-05-24 DIAGNOSIS — Z8619 Personal history of other infectious and parasitic diseases: Secondary | ICD-10-CM | POA: Diagnosis not present

## 2023-05-24 DIAGNOSIS — O09292 Supervision of pregnancy with other poor reproductive or obstetric history, second trimester: Secondary | ICD-10-CM | POA: Diagnosis not present

## 2023-05-24 DIAGNOSIS — Z362 Encounter for other antenatal screening follow-up: Secondary | ICD-10-CM

## 2023-05-31 ENCOUNTER — Encounter: Payer: 59 | Admitting: Family Medicine

## 2023-05-31 ENCOUNTER — Other Ambulatory Visit: Payer: 59

## 2023-06-06 ENCOUNTER — Other Ambulatory Visit: Payer: 59

## 2023-06-13 ENCOUNTER — Ambulatory Visit (INDEPENDENT_AMBULATORY_CARE_PROVIDER_SITE_OTHER): Payer: 59 | Admitting: Family Medicine

## 2023-06-13 ENCOUNTER — Other Ambulatory Visit: Payer: Self-pay

## 2023-06-13 VITALS — BP 127/81 | HR 76 | Wt 335.8 lb

## 2023-06-13 DIAGNOSIS — Z148 Genetic carrier of other disease: Secondary | ICD-10-CM

## 2023-06-13 DIAGNOSIS — O0990 Supervision of high risk pregnancy, unspecified, unspecified trimester: Secondary | ICD-10-CM | POA: Diagnosis not present

## 2023-06-13 DIAGNOSIS — Z8632 Personal history of gestational diabetes: Secondary | ICD-10-CM

## 2023-06-13 DIAGNOSIS — Z3A21 21 weeks gestation of pregnancy: Secondary | ICD-10-CM

## 2023-06-13 DIAGNOSIS — Z8619 Personal history of other infectious and parasitic diseases: Secondary | ICD-10-CM

## 2023-06-13 DIAGNOSIS — R7303 Prediabetes: Secondary | ICD-10-CM

## 2023-06-13 DIAGNOSIS — Z98891 History of uterine scar from previous surgery: Secondary | ICD-10-CM

## 2023-06-13 NOTE — Progress Notes (Signed)
   PRENATAL VISIT NOTE  Subjective:  Joanna Buchanan is a 27 y.o. G3P2002 at [redacted]w[redacted]d being seen today for ongoing prenatal care.  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  Patient reports no complaints.  Contractions: Not present. Vag. Bleeding: None.  Movement: Present. Denies leaking of fluid.   The following portions of the patient's history were reviewed and updated as appropriate: allergies, current medications, past family history, past medical history, past social history, past surgical history and problem list.   Objective:   Vitals:   06/13/23 0854  BP: 127/81  Pulse: 76  Weight: (!) 335 lb 12.8 oz (152.3 kg)    Fetal Status: Fetal Heart Rate (bpm): 140 Fundal Height: 21 cm Movement: Present     General:  Alert, oriented and cooperative. Patient is in no acute distress.  Skin: Skin is warm and dry. No rash noted.   Cardiovascular: Normal heart rate noted  Respiratory: Normal respiratory effort, no problems with respiration noted  Abdomen: Soft, gravid, appropriate for gestational age.  Pain/Pressure: Present     Pelvic: Cervical exam deferred        Extremities: Normal range of motion.  Edema: None  Mental Status: Normal mood and affect. Normal behavior. Normal judgment and thought content.   Assessment and Plan:  Pregnancy: G3P2002 at [redacted]w[redacted]d 1. History of C-section X 2 , for elective repeat  2. History of herpes genitalis Will need ppx at 34-36 weeks  3. History of gestational diabetes mellitus (GDM) High A1C--needs 2 hour  4. Prediabetes - Glucose Tolerance, 2 Hours w/1 Hour  5. Supervision of high risk pregnancy, antepartum Repeat genetics, insufficient  DNA - Glucose Tolerance, 2 Hours w/1 Hour - PANORAMA PRENATAL TEST  6. 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 [redacted] weeks gestation of pregnancy   Preterm labor symptoms and general obstetric precautions including but not limited to vaginal bleeding, contractions, leaking of fluid and fetal movement were reviewed in detail with the patient. Please refer to After Visit Summary for other counseling recommendations.   Return in 4 weeks (on 07/11/2023) for Centering group 15.  Future Appointments  Date Time Provider Department Center  06/15/2023  9:00 AM CENTERING PROVIDER [facility]  06/28/2023  9:15 AM [facility] NURSE [facility]  06/28/2023  9:30 AM [facility] US3 [facility]  07/13/2023  9:00 AM CENTERING PROVIDER [facility]  07/27/2023  9:00 AM CENTERING PROVIDER [facility]  08/10/2023  9:00 AM CENTERING PROVIDER [facility]  08/24/2023  9:00 AM CENTERING PROVIDER [facility]  09/14/2023  2:00 PM CENTERING PROVIDER [facility]  09/21/2023  9:00 AM CENTERING PROVIDER [facility]  10/05/2023  9:00 AM CENTERING PROVIDER [facility]  10/19/2023  9:00 AM CENTERING PROVIDER [facility]    Reva Bores, MD

## 2023-06-14 ENCOUNTER — Telehealth: Payer: Self-pay

## 2023-06-14 ENCOUNTER — Encounter: Payer: Self-pay | Admitting: Family Medicine

## 2023-06-14 DIAGNOSIS — O24419 Gestational diabetes mellitus in pregnancy, unspecified control: Secondary | ICD-10-CM

## 2023-06-14 LAB — GLUCOSE TOLERANCE, 2 HOURS W/ 1HR
Glucose, 1 hour: 179 mg/dL (ref 70–179)
Glucose, 2 hour: 165 mg/dL — ABNORMAL HIGH (ref 70–152)
Glucose, Fasting: 105 mg/dL — ABNORMAL HIGH (ref 70–91)

## 2023-06-14 NOTE — Telephone Encounter (Addendum)
Left patient VM to call us back regarding her 2 hr gtt results.   Marcelino Duster, RN  ----- Message from Reva Bores sent at 06/14/2023  8:42 AM EDT ----- Has GDM-please refer for teaching, and send in supplies

## 2023-06-15 ENCOUNTER — Encounter: Payer: Self-pay | Admitting: *Deleted

## 2023-06-15 ENCOUNTER — Encounter: Payer: 59 | Admitting: Family Medicine

## 2023-06-15 NOTE — Progress Notes (Deleted)
   PRENATAL VISIT NOTE  Subjective:  Joanna Buchanan is a 27 y.o. G3P2002 at [redacted]w[redacted]d being seen today for ongoing prenatal care.  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  Patient reports {[id number]}.   .  .   . Denies leaking of fluid.   The following portions of the patient's history were reviewed and updated as appropriate: allergies, current medications, past family history, past medical history, past social history, past surgical history and problem list.   Objective:  There were no vitals filed for this visit.  Fetal Status:           General:  Alert, oriented and cooperative. Patient is in no acute distress.  Skin: Skin is warm and dry. No rash noted.   Cardiovascular: Normal heart rate noted  Respiratory: Normal respiratory effort, no problems with respiration noted  Abdomen: Soft, gravid, appropriate for gestational age.        Pelvic: {Blank single:19197::"Cervical exam performed in the presence of a chaperone","Cervical exam deferred"}        Extremities: Normal range of motion.     Mental Status: Normal mood and affect. Normal behavior. Normal judgment and thought content.   Assessment and Plan:  Pregnancy: G3P2002 at [redacted]w[redacted]d 1. Gestational diabetes mellitus (GDM) in second trimester controlled on oral hypoglycemic drug ***  2. History of C-section ***  3. History of herpes genitalis ***  4. Obesity in pregnancy ***  5. Supervision of high risk pregnancy, antepartum ***  {Blank single:19197::"Term","Preterm"} labor symptoms and general obstetric precautions including but not limited to vaginal bleeding, contractions, leaking of fluid and fetal  movement were reviewed in detail with the patient. Please refer to After Visit Summary for other counseling recommendations.   No follow-ups on file.  Future Appointments  Date Time Provider Department Center  06/28/2023  9:15 AM [facility] NURSE [facility]  06/28/2023  9:30 AM [facility] US3 [facility]  07/13/2023  9:00 AM CENTERING PROVIDER [facility]  07/27/2023  9:00 AM CENTERING PROVIDER [facility]  08/10/2023  9:00 AM CENTERING PROVIDER [facility]  08/24/2023  9:00 AM CENTERING PROVIDER [facility]  09/14/2023  2:00 PM CENTERING PROVIDER [facility]  09/21/2023  9:00 AM CENTERING PROVIDER [facility]  10/05/2023  9:00 AM CENTERING PROVIDER [facility]  10/19/2023  9:00 AM CENTERING PROVIDER [facility]    Federico Flake, MD

## 2023-06-16 MED ORDER — ACCU-CHEK GUIDE W/DEVICE KIT: PACK | 0 refills | Status: DC

## 2023-06-16 MED ORDER — ACCU-CHEK SOFTCLIX LANCETS MISC: 12 refills | Status: DC

## 2023-06-16 MED ORDER — GLUCOSE BLOOD VI STRP: ORAL_STRIP | 12 refills | Status: DC

## 2023-06-16 NOTE — Telephone Encounter (Addendum)
Called pt and she did not answer.  I left message stating that I see she has reviewed the message from Dr. Shawnie Pons regarding her glucose test results. I have scheduled an appointment for her with Diabetes Educator on Tuesday 10/15 @ 11:15 am. The prescription for her supplies will be sent to her pharmacy. She will need to obtain them and bring to the appt. She may call us if she has questions.

## 2023-06-16 NOTE — Addendum Note (Signed)
Addended by: Jill Side on: 06/16/2023 04:20 PM   Modules accepted: Orders

## 2023-06-18 LAB — PANORAMA PRENATAL TEST FULL PANEL:PANORAMA TEST PLUS 5 ADDITIONAL MICRODELETIONS: FETAL FRACTION: 4

## 2023-06-21 ENCOUNTER — Other Ambulatory Visit: Payer: 59

## 2023-06-28 ENCOUNTER — Ambulatory Visit: Payer: 59

## 2023-06-30 ENCOUNTER — Encounter: Payer: 59 | Admitting: Obstetrics and Gynecology

## 2023-06-30 ENCOUNTER — Ambulatory Visit: Payer: 59

## 2023-07-13 ENCOUNTER — Encounter: Payer: 59 | Admitting: Family Medicine

## 2023-07-13 ENCOUNTER — Telehealth: Payer: Self-pay | Admitting: *Deleted

## 2023-07-13 ENCOUNTER — Encounter: Payer: 59 | Admitting: Obstetrics and Gynecology

## 2023-07-13 NOTE — Telephone Encounter (Signed)
Shannen has DNKA 3 [facility] prenatal appointments. I called to discuss with her and reached her voicemail. I left a message I was calling to let her know she missed an appointment this am and  to remind her she has an appointment today at 1:15 and that I will send additional information in a MyChart message for her to read. She will be removed from Centering and enrolled in traditioinal care. If she expresses  a desire to remain in Centering and confirms she can attend next appointment on 07/27/23, 38m-11am she can be re-enrolled. Nancy Fetter

## 2023-07-14 ENCOUNTER — Encounter: Payer: 59 | Admitting: Certified Nurse Midwife

## 2023-07-14 DIAGNOSIS — O0992 Supervision of high risk pregnancy, unspecified, second trimester: Secondary | ICD-10-CM

## 2023-07-14 DIAGNOSIS — O24419 Gestational diabetes mellitus in pregnancy, unspecified control: Secondary | ICD-10-CM

## 2023-07-14 DIAGNOSIS — B009 Herpesviral infection, unspecified: Secondary | ICD-10-CM

## 2023-07-14 DIAGNOSIS — Z3A26 26 weeks gestation of pregnancy: Secondary | ICD-10-CM

## 2023-07-14 DIAGNOSIS — O34219 Maternal care for unspecified type scar from previous cesarean delivery: Secondary | ICD-10-CM

## 2023-07-15 ENCOUNTER — Ambulatory Visit: Payer: 59 | Attending: Maternal & Fetal Medicine

## 2023-07-15 ENCOUNTER — Ambulatory Visit: Payer: 59

## 2023-07-21 ENCOUNTER — Encounter: Payer: 59 | Admitting: Certified Nurse Midwife

## 2023-07-21 DIAGNOSIS — O0992 Supervision of high risk pregnancy, unspecified, second trimester: Secondary | ICD-10-CM

## 2023-07-21 DIAGNOSIS — O24419 Gestational diabetes mellitus in pregnancy, unspecified control: Secondary | ICD-10-CM

## 2023-07-21 DIAGNOSIS — O34219 Maternal care for unspecified type scar from previous cesarean delivery: Secondary | ICD-10-CM

## 2023-07-21 DIAGNOSIS — Z3A27 27 weeks gestation of pregnancy: Secondary | ICD-10-CM

## 2023-07-23 ENCOUNTER — Inpatient Hospital Stay (HOSPITAL_COMMUNITY)
Admission: AD | Admit: 2023-07-23 | Discharge: 2023-07-24 | Disposition: A | Discharge status: Home / Self Care | Payer: 59 | Attending: Family Medicine | Admitting: Family Medicine

## 2023-07-23 ENCOUNTER — Encounter (HOSPITAL_COMMUNITY): Payer: Self-pay | Admitting: Family Medicine

## 2023-07-23 DIAGNOSIS — R1013 Epigastric pain: Secondary | ICD-10-CM | POA: Diagnosis not present

## 2023-07-23 DIAGNOSIS — K219 Gastro-esophageal reflux disease without esophagitis: Secondary | ICD-10-CM | POA: Diagnosis not present

## 2023-07-23 DIAGNOSIS — Z3A27 27 weeks gestation of pregnancy: Secondary | ICD-10-CM | POA: Diagnosis not present

## 2023-07-23 DIAGNOSIS — O26892 Other specified pregnancy related conditions, second trimester: Secondary | ICD-10-CM | POA: Diagnosis not present

## 2023-07-23 MED ORDER — ALUM & MAG HYDROXIDE-SIMETH 200-200-20 MG/5ML PO SUSP
30.0000 mL | Freq: Once | ORAL | Status: AC
  Administered 2023-07-23: 30 mL via ORAL
  Filled 2023-07-23: qty 30

## 2023-07-23 MED ORDER — PANTOPRAZOLE SODIUM 40 MG PO TBEC
40.0000 mg | DELAYED_RELEASE_TABLET | Freq: Once | ORAL | Status: AC
  Administered 2023-07-24: 40 mg via ORAL
  Filled 2023-07-23: qty 1

## 2023-07-23 NOTE — MAU Note (Signed)
..  Joanna Buchanan is a 27 y.o. at [redacted]w[redacted]d here in MAU reporting: acid reflux for a few days that has not gotten better after TUMS and Pepto; now having burning chest pain that is an 8/10. Also, reports pelvic pain that has been going on for 3 weeks and rates it a 6/10 Denies vaginal bleeding or leaking of fluid. +FM   Pain score: refer to note  WUJ:[id number] in room  Lab orders placed from triage: UA

## 2023-07-24 DIAGNOSIS — O26892 Other specified pregnancy related conditions, second trimester: Secondary | ICD-10-CM | POA: Diagnosis not present

## 2023-07-24 MED ORDER — PANTOPRAZOLE SODIUM 20 MG PO TBEC: 20.0000 mg | DELAYED_RELEASE_TABLET | Freq: Every day | ORAL | 1 refills | Status: DC

## 2023-07-24 NOTE — MAU Provider Note (Signed)
Obstetric Attending MAU Note  Chief Complaint:  Chest Pain and Heartburn   Event Date/Time   First Provider Initiated Contact with Patient 07/23/23 2333     HPI: Joanna Buchanan is a 27 y.o. [id number] at [redacted]w[redacted]d who presents to maternity admissions reporting epigastric pain. This has been ongoing for 1 month. Has hot acid in back of throat. Not necessarily related to types of food. Denies contractions, leakage of fluid or vaginal bleeding. Good fetal movement.   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 A2/B GDM)  Required Referrals for A1GDM or A2GDM: [ ]  Diabetes Education and Testing Supplies [ ]  Nutrition Cousult  For A2/B GDM or higher classes of DM [ ]  Diabetes Education and Testing Supplies [ ]  Nutrition Counsult [ ]  Fetal ECHO after 20 weeks  [ ]  Eye exam for retina evaluation   Baseline and surveillance labs (   Herpes, genital    Medical history non-contributory     OB History  Gravida Para Term Preterm AB Living  3 2 2  0 0 2  SAB IAB Ectopic Multiple Live Births  0 0 0 0 2    # Outcome Date GA Lbr Len/2nd Weight Sex Type Anes PTL Lv  3 Current           2 Term 05/19/22 [redacted]w[redacted]d  2990 g F CS-Vac Spinal  LIV     Birth Comments: Early term female with intermittent grunting; improved after PPV/CPAP.GDM  1 Term 2019 [redacted]w[redacted]d  2722 g  CS-LTranv EPI  LIV     Birth  Comments: for HSV outbreak    Past Surgical History:  Procedure Laterality Date   CESAREAN SECTION     CESAREAN SECTION N/A 05/19/2022   Procedure: CESAREAN SECTION;  Surgeon: Federico Flake, MD;  Location: [facility] LD ORS;  Service: Obstetrics;  Laterality: N/A;   TONSILLECTOMY      Family History: Family History  Problem Relation Age of Onset   Diabetes Mother    Hypertension Mother    Asthma Mother    Multiple sclerosis Mother    Cancer Maternal Aunt    Diabetes Maternal Grandmother    Birth defects Neg Hx    Heart disease Neg Hx    Stroke Neg Hx     Social History: Social History   Tobacco Use   Smoking status: Never   Smokeless tobacco: Never  Vaping Use   Vaping status: Never Used  Substance Use Topics   Alcohol use: Never   Drug use: Yes    Types: Marijuana    Comment: once a day for appetite    Allergies:  Allergies  Allergen Reactions   Other Anaphylaxis, Shortness Of Breath and Swelling    Avocado, watermelon, cherries, bananas, oranges cause throat swelling   Shellfish Allergy Itching    Medications Prior to Admission  Medication Sig Dispense Refill Last  Dose   Prenatal Vit-Fe Fumarate-FA (PRENATAL VITAMIN PO) Take 1 tablet by mouth daily at 6 (six) AM.   Past Week   Accu-Chek Softclix Lancets lancets Use 4 times daily as instructed 100 each 12    aspirin EC 81 MG tablet Take 1 tablet (81 mg total) by mouth daily. 90 tablet 3    Blood Glucose Monitoring Suppl (ACCU-CHEK GUIDE) w/Device KIT Use 4 times daily as directed 1 kit 0    glucose blood test strip Use 4 times daily as instructed 100 each 12     ROS: Pertinent findings in history of present illness.  Physical Exam  Blood pressure 125/62, pulse 84, temperature 98.3 F (36.8 C), temperature source Oral, resp. rate 15, height 5\' 8"  (1.727 m), weight (!) 153 kg, last menstrual period 01/11/2023, SpO2 99%, not currently breastfeeding. CONSTITUTIONAL: Well-developed, well-nourished female in  no acute distress.  HENT:  Normocephalic, atraumatic, External right and left ear normal. Oropharynx is clear and moist EYES: Conjunctivae and EOM are normal. Pupils are equal, round, and reactive to light. No scleral icterus.  NECK: Normal range of motion, supple, no masses SKIN: Skin is warm and dry. No rash noted. Not diaphoretic. No erythema. No pallor. NEUROLGIC: Alert and oriented to person, place, and time. Normal reflexes, muscle tone coordination. No cranial nerve deficit noted. PSYCHIATRIC: Normal mood and affect. Normal behavior. Normal judgment and thought content. CARDIOVASCULAR: Normal heart rate noted, regular rhythm RESPIRATORY: Effort and breath sounds normal, no problems with respiration noted ABDOMEN: Soft, nontender, nondistended, gravid appropriate for gestational age MUSCULOSKELETAL: Normal range of motion. No edema and no tenderness. 2+ distal pulses.  SPECULUM EXAM: NEFG, physiologic discharge, no blood, cervix clean    FHT:  Baseline 145 , moderate variability, accelerations present, no decelerations Contractions: quiet   Labs: No results found for this or any previous visit (from the past 24 hour(s)).  Imaging:  No results found.  MAU Course: Given GI cocktail and PPI Symptoms improved  Assessment: 1. Gastroesophageal reflux disease without esophagitis   2. [redacted] weeks gestation of pregnancy     Plan: Discharge home Rx for PPI Labor precautions and fetal kick counts reviewed Follow up with OB provider   Follow-up Information     [facility] for [facility] Follow up.   Specialty: Obstetrics and Gynecology Contact information: [address] [place] [address] [phone]                Allergies as of 07/24/2023       Reactions   Other Anaphylaxis, Shortness Of Breath, Swelling   Avocado, watermelon, cherries, bananas, oranges cause throat swelling   Shellfish Allergy Itching         Medication List     TAKE these medications    Accu-Chek Guide w/Device Kit Use 4 times daily as directed   Accu-Chek Softclix Lancets lancets Use 4 times daily as instructed   aspirin EC 81 MG tablet Take 1 tablet (81 mg total) by mouth daily.   glucose blood test strip Use 4 times daily as instructed   pantoprazole 20 MG tablet Commonly known as: Protonix Take 1 tablet (20 mg total) by mouth daily.   PRENATAL VITAMIN PO Take 1 tablet by mouth daily at 6 (six) AM.        Reva Bores, MD 07/24/2023 1:11 AM

## 2023-07-25 ENCOUNTER — Other Ambulatory Visit: Payer: Self-pay

## 2023-07-25 ENCOUNTER — Ambulatory Visit (INDEPENDENT_AMBULATORY_CARE_PROVIDER_SITE_OTHER): Payer: 59 | Admitting: Obstetrics and Gynecology

## 2023-07-25 VITALS — BP 120/86 | HR 86 | Wt 338.0 lb

## 2023-07-25 DIAGNOSIS — Z23 Encounter for immunization: Secondary | ICD-10-CM | POA: Diagnosis not present

## 2023-07-25 DIAGNOSIS — O0990 Supervision of high risk pregnancy, unspecified, unspecified trimester: Secondary | ICD-10-CM | POA: Diagnosis not present

## 2023-07-25 DIAGNOSIS — Z3A27 27 weeks gestation of pregnancy: Secondary | ICD-10-CM

## 2023-07-25 DIAGNOSIS — O0992 Supervision of high risk pregnancy, unspecified, second trimester: Secondary | ICD-10-CM

## 2023-07-25 DIAGNOSIS — Z8619 Personal history of other infectious and parasitic diseases: Secondary | ICD-10-CM

## 2023-07-25 DIAGNOSIS — Z148 Genetic carrier of other disease: Secondary | ICD-10-CM

## 2023-07-25 DIAGNOSIS — Z98891 History of uterine scar from previous surgery: Secondary | ICD-10-CM

## 2023-07-25 DIAGNOSIS — O24419 Gestational diabetes mellitus in pregnancy, unspecified control: Secondary | ICD-10-CM

## 2023-07-25 MED ORDER — DEXCOM G7 SENSOR MISC: 1.0000 [IU] | 6 refills | Status: DC

## 2023-07-25 MED ORDER — DEXCOM G7 RECEIVER DEVI: 1.0000 [IU] | 6 refills | Status: DC

## 2023-07-25 NOTE — Progress Notes (Signed)
Pt reports a lot of vagina Pain

## 2023-07-25 NOTE — Progress Notes (Signed)
   PRENATAL VISIT NOTE  Subjective:  Joanna Buchanan is a 27 y.o. G3P2002 at [redacted]w[redacted]d being seen today for ongoing prenatal care.  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  Patient reports heartburn.  Contractions: Irritability. Vag. Bleeding: None.  Movement: Present. Denies leaking of fluid.   The following portions of the patient's history were reviewed and updated as appropriate: allergies, current medications, past family history, past medical history, past social history, past surgical history and problem list.   Objective:   Vitals:   07/25/23 0832  BP: 120/86  Pulse: 86  Weight: (!) 338 lb (153.3 kg)   Fetal Status: Fetal Heart Rate (bpm): 141   Movement: Present     General:  Alert, oriented and cooperative. Patient is in no acute distress.  Skin: Skin is warm and dry. No rash noted.   Cardiovascular: Normal heart rate noted  Respiratory: Normal respiratory effort, no problems with respiration noted  Abdomen: Soft, gravid, appropriate for gestational age.  Pain/Pressure: Present      Assessment and Plan:  Pregnancy: G3P2002 at [redacted]w[redacted]d 1. Supervision of high risk pregnancy, antepartum 2. [redacted] weeks gestation of pregnancy Is going to switch to traditional prenatal care model, can cancel centering appt Tdap today CBC, HIV, RPR ordered  3. Gestational diabetes mellitus (GDM) in second trimester, gestational diabetes method of control unspecified - Reviewed diagnosis of GDM - Discussed the risks associated in pregnancy especially with poor control including but not limited to increased risk of preeclampsia, macrosomia, need for operative delivery I.e. vacuum, forcep, c-section, shoulder dystocia and  resulting potential nerve injury.  - Discussed diet and exercise modifications. Diabetes education referral recommended. Patient reports that she will not be able to check her sugars due to lifestyle. CGM ordered - We discussed the possibility of management of the pregnancy with medications I.e. Metformin or Insulin. Discussed if medication initiated, that monitoring during the pregnancy will be started at 32 wks or at the time of medication initiation. Serial [place] for growth would be started as well. Discussed that the timing of delivery would then be in the 39th week - Counseled on importance of postpartum follow up testing to ensure resolution and ensure patient does not have ongoing DM - Discussed lifelong increased risk of DM and increased risk of GDM in future pregnancies  4. History of C-section G1 HSV, G2 elective repeat Will address mode of delivery next appt  5. 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 Hx HSV Suppression by 36 weeks  Preterm labor symptoms and general obstetric precautions including but not limited to vaginal bleeding, contractions, leaking of fluid and fetal movement were reviewed in detail with the patient. Please refer to After Visit Summary for other counseling recommendations.   Return in about 2 weeks (around 08/08/2023).  No future appointments.  Lennart Pall, MD

## 2023-07-25 NOTE — Patient Instructions (Signed)
Maalox or mylanta - other medicine for heartburn

## 2023-07-26 LAB — CBC
Hematocrit: 34.1 % (ref 34.0–46.6)
Hemoglobin: 11.1 g/dL (ref 11.1–15.9)
MCH: 29.6 pg (ref 26.6–33.0)
MCHC: 32.6 g/dL (ref 31.5–35.7)
MCV: 91 fL (ref 79–97)
Platelets: 287 10*3/uL (ref 150–450)
RBC: 3.75 x10E6/uL — ABNORMAL LOW (ref 3.77–5.28)
RDW: 13.1 % (ref 11.7–15.4)
WBC: 7.7 10*3/uL (ref 3.4–10.8)

## 2023-07-26 LAB — RPR: RPR Ser Ql: NONREACTIVE

## 2023-07-26 LAB — HIV ANTIBODY (ROUTINE TESTING W REFLEX): HIV Screen 4th Generation wRfx: NONREACTIVE

## 2023-07-29 ENCOUNTER — Ambulatory Visit: Payer: 59 | Attending: Maternal & Fetal Medicine

## 2023-07-29 ENCOUNTER — Ambulatory Visit: Payer: 59 | Admitting: *Deleted

## 2023-07-29 ENCOUNTER — Other Ambulatory Visit: Payer: Self-pay | Admitting: *Deleted

## 2023-07-29 VITALS — BP 125/62 | HR 74

## 2023-07-29 DIAGNOSIS — E669 Obesity, unspecified: Secondary | ICD-10-CM | POA: Diagnosis not present

## 2023-07-29 DIAGNOSIS — Z3A28 28 weeks gestation of pregnancy: Secondary | ICD-10-CM | POA: Diagnosis not present

## 2023-07-29 DIAGNOSIS — O285 Abnormal chromosomal and genetic finding on antenatal screening of mother: Secondary | ICD-10-CM | POA: Diagnosis not present

## 2023-07-29 DIAGNOSIS — O3663X0 Maternal care for excessive fetal growth, third trimester, not applicable or unspecified: Secondary | ICD-10-CM | POA: Diagnosis not present

## 2023-07-29 DIAGNOSIS — Z8619 Personal history of other infectious and parasitic diseases: Secondary | ICD-10-CM | POA: Insufficient documentation

## 2023-07-29 DIAGNOSIS — O0990 Supervision of high risk pregnancy, unspecified, unspecified trimester: Secondary | ICD-10-CM | POA: Diagnosis not present

## 2023-07-29 DIAGNOSIS — O43193 Other malformation of placenta, third trimester: Secondary | ICD-10-CM

## 2023-07-29 DIAGNOSIS — O09293 Supervision of pregnancy with other poor reproductive or obstetric history, third trimester: Secondary | ICD-10-CM

## 2023-07-29 DIAGNOSIS — O99210 Obesity complicating pregnancy, unspecified trimester: Secondary | ICD-10-CM | POA: Insufficient documentation

## 2023-07-29 DIAGNOSIS — Z98891 History of uterine scar from previous surgery: Secondary | ICD-10-CM

## 2023-07-29 DIAGNOSIS — O99213 Obesity complicating pregnancy, third trimester: Secondary | ICD-10-CM

## 2023-07-29 DIAGNOSIS — O24419 Gestational diabetes mellitus in pregnancy, unspecified control: Secondary | ICD-10-CM | POA: Diagnosis not present

## 2023-07-29 DIAGNOSIS — O99212 Obesity complicating pregnancy, second trimester: Secondary | ICD-10-CM | POA: Diagnosis not present

## 2023-07-29 DIAGNOSIS — O34219 Maternal care for unspecified type scar from previous cesarean delivery: Secondary | ICD-10-CM

## 2023-07-29 DIAGNOSIS — Z148 Genetic carrier of other disease: Secondary | ICD-10-CM | POA: Diagnosis not present

## 2023-07-29 DIAGNOSIS — Z362 Encounter for other antenatal screening follow-up: Secondary | ICD-10-CM | POA: Diagnosis not present

## 2023-08-09 ENCOUNTER — Other Ambulatory Visit: Payer: 59

## 2023-08-11 ENCOUNTER — Encounter: Payer: Self-pay | Admitting: Certified Nurse Midwife

## 2023-08-17 DIAGNOSIS — Z3A32 32 weeks gestation of pregnancy: Secondary | ICD-10-CM | POA: Insufficient documentation

## 2023-08-17 DIAGNOSIS — R040 Epistaxis: Secondary | ICD-10-CM | POA: Diagnosis not present

## 2023-08-17 DIAGNOSIS — Z5321 Procedure and treatment not carried out due to patient leaving prior to being seen by health care provider: Secondary | ICD-10-CM | POA: Diagnosis not present

## 2023-08-17 DIAGNOSIS — R0602 Shortness of breath: Secondary | ICD-10-CM | POA: Insufficient documentation

## 2023-08-17 DIAGNOSIS — O26893 Other specified pregnancy related conditions, third trimester: Secondary | ICD-10-CM | POA: Diagnosis not present

## 2023-08-17 DIAGNOSIS — O26899 Other specified pregnancy related conditions, unspecified trimester: Secondary | ICD-10-CM | POA: Insufficient documentation

## 2023-08-17 DIAGNOSIS — R109 Unspecified abdominal pain: Secondary | ICD-10-CM | POA: Diagnosis not present

## 2023-08-19 DIAGNOSIS — Z09 Encounter for follow-up examination after completed treatment for conditions other than malignant neoplasm: Secondary | ICD-10-CM | POA: Insufficient documentation

## 2023-08-23 ENCOUNTER — Other Ambulatory Visit: Payer: Self-pay

## 2023-08-23 ENCOUNTER — Ambulatory Visit (INDEPENDENT_AMBULATORY_CARE_PROVIDER_SITE_OTHER): Payer: 59 | Admitting: Advanced Practice Midwife

## 2023-08-23 VITALS — BP 120/71 | HR 90 | Wt 336.4 lb

## 2023-08-23 DIAGNOSIS — Z5901 Sheltered homelessness: Secondary | ICD-10-CM

## 2023-08-23 DIAGNOSIS — R42 Dizziness and giddiness: Secondary | ICD-10-CM

## 2023-08-23 DIAGNOSIS — Z3A32 32 weeks gestation of pregnancy: Secondary | ICD-10-CM

## 2023-08-23 DIAGNOSIS — K219 Gastro-esophageal reflux disease without esophagitis: Secondary | ICD-10-CM

## 2023-08-23 DIAGNOSIS — O99613 Diseases of the digestive system complicating pregnancy, third trimester: Secondary | ICD-10-CM

## 2023-08-23 DIAGNOSIS — O24419 Gestational diabetes mellitus in pregnancy, unspecified control: Secondary | ICD-10-CM | POA: Diagnosis not present

## 2023-08-23 DIAGNOSIS — R062 Wheezing: Secondary | ICD-10-CM

## 2023-08-23 MED ORDER — FAMOTIDINE 20 MG PO TABS: 20.0000 mg | ORAL_TABLET | Freq: Two times a day (BID) | ORAL | 3 refills | Status: DC

## 2023-08-23 MED ORDER — ALBUTEROL SULFATE HFA 108 (90 BASE) MCG/ACT IN AERS
2.0000 | INHALATION_SPRAY | Freq: Four times a day (QID) | RESPIRATORY_TRACT | 2 refills | Status: DC | PRN
Start: 2023-08-23 — End: 2023-09-27

## 2023-08-23 MED ORDER — AEROCHAMBER PLUS FLO-VU MISC: 1 refills | Status: DC

## 2023-08-23 MED ORDER — PANTOPRAZOLE SODIUM 20 MG PO TBEC: 20.0000 mg | DELAYED_RELEASE_TABLET | Freq: Two times a day (BID) | ORAL | 3 refills | Status: DC

## 2023-08-23 NOTE — Progress Notes (Addendum)
PRENATAL VISIT NOTE  Subjective:  Joanna Buchanan is a 27 y.o. G3P2002 at [redacted]w[redacted]d being seen today for ongoing prenatal care.  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  Patient reports occasional contractions and cough, SOB, congestion  .  Contractions: Irritability. Vag. Bleeding: None.  Movement: Present. Denies leaking of fluid.   Was not able to get Dexcom GCM due problem with the pharmacy. Clinical staff called to address issue.   The following portions of the patient's history were reviewed and updated as appropriate: allergies, current medications, past family history, past medical history, past social history, past surgical history and problem list.   Objective:   Vitals:   08/23/23 1454  BP: 120/71  Pulse: 90  Weight: (!) 336 lb 6.4 oz (152.6 kg)    Fetal Status: Fetal Heart Rate (bpm): 133   Movement: Present     General:  Alert, oriented and cooperative. Patient is in no acute distress.  Skin: Skin is warm and dry. No rash noted.   Cardiovascular: Normal heart rate noted  Respiratory: Normal respiratory effort and rate, Mild wheezing auscultated. Otherwise clear.   Abdomen: Soft, gravid, appropriate for gestational age.  Pain/Pressure: Present     Pelvic: Cervical exam deferred        Extremities: Normal range of motion.  Edema: None  Mental Status: Normal mood and affect. Normal behavior. Normal judgment and thought content.   Random CBG: 87   Assessment and Plan:  Pregnancy: G3P2002 at [redacted]w[redacted]d 1. Dizziness (Primary) - VSS - CBC - Comp Met (CMET)  2. Gestational diabetes mellitus (GDM) in third trimester, gestational diabetes method of control unspecified - Not checking CBGs due  to living situation (lives in car) and problem with pharmacy not getting Dexcom. Clinical staff call pharmacy to correct issue. Strongly urged pt to get Dexcom.  - Pt DNKA w/ DM educator. Had GDM in previous pregnancy.  - Hemoglobin A1c - Brief discussion about importance of testing blood sugars, bringing log book and keeping appointments. Discussed risks of uncontrolled DM in pregnancy including delayed fetal lung maturity, IUFD and shoulder dystocia possibly resulting brachial plexus palsy, brain damage, intrapartum death. Planned C/S does not eliminated risk of birth difficulties.  - Start antenatal testing now.  3. Wheezing-C/W URI. Was seen at [facility] ED w/ full workup benign.  - Rx Albuterol and spacing  - REc seeing PCP if Sx fail to improve   4. [redacted] weeks gestation of pregnancy  5. Unhoused. Living in car.  - Will see if pregnancy navigator can be of assistance  Preterm labor symptoms and general obstetric precautions including but not limited to vaginal bleeding, contractions, leaking of fluid and fetal movement were reviewed in detail with the patient. Please refer to After Visit Summary for other counseling recommendations.   Return in about 1 week (around 08/30/2023) for Diabetes education for Dexcom ASAP. ROB in 1 week.  Future Appointments  Date Time Provider Department Center  08/25/2023 10:15 AM [facility] NURSE [facility]  08/25/2023 10:30 AM [facility] US5 [facility]  09/06/2023  1:15 PM [facility] NURSE [facility]  09/06/2023  1:30 PM [facility] US2 [facility]  09/09/2023  1:15 PM [facility] NURSE [facility]  09/09/2023  1:30 PM [facility] US4 [facility]  09/16/2023  1:15 PM [facility] NURSE [facility]  09/16/2023  1:30 PM [facility] US4 [facility]    Dorathy Kinsman, CNM

## 2023-08-23 NOTE — Patient Instructions (Signed)

## 2023-08-23 NOTE — Addendum Note (Signed)
Addended by: Dorathy Kinsman on: 08/23/2023 03:59 PM   Modules accepted: Orders

## 2023-08-24 LAB — CBC
Hematocrit: 37.3 % (ref 34.0–46.6)
Hemoglobin: 12 g/dL (ref 11.1–15.9)
MCH: 29.1 pg (ref 26.6–33.0)
MCHC: 32.2 g/dL (ref 31.5–35.7)
MCV: 91 fL (ref 79–97)
Platelets: 348 10*3/uL (ref 150–450)
RBC: 4.12 x10E6/uL (ref 3.77–5.28)
RDW: 12.7 % (ref 11.7–15.4)
WBC: 9.7 10*3/uL (ref 3.4–10.8)

## 2023-08-24 LAB — COMPREHENSIVE METABOLIC PANEL
ALT: 13 [IU]/L (ref 0–32)
AST: 12 [IU]/L (ref 0–40)
Albumin: 3.9 g/dL — ABNORMAL LOW (ref 4.0–5.0)
Alkaline Phosphatase: 123 [IU]/L — ABNORMAL HIGH (ref 44–121)
BUN/Creatinine Ratio: 14 (ref 9–23)
BUN: 8 mg/dL (ref 6–20)
Bilirubin Total: <0.2 mg/dL (ref 0.0–1.2)
CO2: 21 mmol/L (ref 20–29)
Calcium: 9.6 mg/dL (ref 8.7–10.2)
Chloride: 103 mmol/L (ref 96–106)
Creatinine, Ser: 0.58 mg/dL (ref 0.57–1.00)
Globulin, Total: 3 g/dL (ref 1.5–4.5)
Glucose: 88 mg/dL (ref 70–99)
Potassium: 4.6 mmol/L (ref 3.5–5.2)
Sodium: 137 mmol/L (ref 134–144)
Total Protein: 6.9 g/dL (ref 6.0–8.5)
eGFR: 129 mL/min/{1.73_m2} (ref >59)

## 2023-08-24 LAB — HEMOGLOBIN A1C
Est. average glucose Bld gHb Est-mCnc: 134 mg/dL
Hgb A1c MFr Bld: 6.3 % — ABNORMAL HIGH (ref 4.8–5.6)

## 2023-08-24 LAB — GLUCOSE, CAPILLARY: Glucose-Capillary: 87 mg/dL (ref 70–99)

## 2023-08-25 ENCOUNTER — Ambulatory Visit: Payer: 59 | Attending: Obstetrics

## 2023-08-25 ENCOUNTER — Other Ambulatory Visit: Payer: Self-pay

## 2023-08-25 ENCOUNTER — Ambulatory Visit: Payer: 59 | Admitting: *Deleted

## 2023-08-25 ENCOUNTER — Other Ambulatory Visit: Payer: Self-pay | Admitting: *Deleted

## 2023-08-25 VITALS — BP 128/73 | HR 88

## 2023-08-25 DIAGNOSIS — O24419 Gestational diabetes mellitus in pregnancy, unspecified control: Secondary | ICD-10-CM

## 2023-08-25 DIAGNOSIS — Z148 Genetic carrier of other disease: Secondary | ICD-10-CM | POA: Diagnosis not present

## 2023-08-25 DIAGNOSIS — Z98891 History of uterine scar from previous surgery: Secondary | ICD-10-CM | POA: Diagnosis not present

## 2023-08-25 DIAGNOSIS — O99210 Obesity complicating pregnancy, unspecified trimester: Secondary | ICD-10-CM

## 2023-08-25 DIAGNOSIS — O43193 Other malformation of placenta, third trimester: Secondary | ICD-10-CM

## 2023-08-25 DIAGNOSIS — O09293 Supervision of pregnancy with other poor reproductive or obstetric history, third trimester: Secondary | ICD-10-CM | POA: Diagnosis not present

## 2023-08-25 DIAGNOSIS — E669 Obesity, unspecified: Secondary | ICD-10-CM

## 2023-08-25 DIAGNOSIS — Z3A32 32 weeks gestation of pregnancy: Secondary | ICD-10-CM

## 2023-08-25 DIAGNOSIS — Z8619 Personal history of other infectious and parasitic diseases: Secondary | ICD-10-CM

## 2023-08-25 DIAGNOSIS — O3663X0 Maternal care for excessive fetal growth, third trimester, not applicable or unspecified: Secondary | ICD-10-CM | POA: Diagnosis not present

## 2023-08-25 DIAGNOSIS — O99213 Obesity complicating pregnancy, third trimester: Secondary | ICD-10-CM | POA: Diagnosis not present

## 2023-08-25 DIAGNOSIS — O0990 Supervision of high risk pregnancy, unspecified, unspecified trimester: Secondary | ICD-10-CM | POA: Diagnosis not present

## 2023-08-25 DIAGNOSIS — O34219 Maternal care for unspecified type scar from previous cesarean delivery: Secondary | ICD-10-CM

## 2023-08-28 ENCOUNTER — Encounter: Payer: Self-pay | Admitting: Advanced Practice Midwife

## 2023-08-28 DIAGNOSIS — Z5901 Sheltered homelessness: Secondary | ICD-10-CM

## 2023-08-28 HISTORY — DX: Sheltered homelessness: Z59.01

## 2023-08-28 NOTE — Assessment & Plan Note (Signed)
08/23/23: Living in car

## 2023-09-06 ENCOUNTER — Ambulatory Visit: Payer: 59

## 2023-09-06 ENCOUNTER — Ambulatory Visit: Payer: 59 | Attending: Obstetrics

## 2023-09-08 ENCOUNTER — Encounter: Payer: 59 | Admitting: Obstetrics and Gynecology

## 2023-09-08 ENCOUNTER — Other Ambulatory Visit: Payer: 59

## 2023-09-09 ENCOUNTER — Ambulatory Visit: Payer: 59 | Attending: Obstetrics

## 2023-09-09 ENCOUNTER — Ambulatory Visit: Payer: 59 | Attending: Obstetrics | Admitting: Obstetrics

## 2023-09-09 ENCOUNTER — Other Ambulatory Visit: Payer: Self-pay | Admitting: *Deleted

## 2023-09-09 ENCOUNTER — Ambulatory Visit: Payer: 59 | Admitting: *Deleted

## 2023-09-09 VITALS — BP 123/71 | HR 107

## 2023-09-09 DIAGNOSIS — Z8619 Personal history of other infectious and parasitic diseases: Secondary | ICD-10-CM

## 2023-09-09 DIAGNOSIS — Z3A34 34 weeks gestation of pregnancy: Secondary | ICD-10-CM | POA: Diagnosis not present

## 2023-09-09 DIAGNOSIS — O24410 Gestational diabetes mellitus in pregnancy, diet controlled: Secondary | ICD-10-CM

## 2023-09-09 DIAGNOSIS — O09293 Supervision of pregnancy with other poor reproductive or obstetric history, third trimester: Secondary | ICD-10-CM

## 2023-09-09 DIAGNOSIS — O43193 Other malformation of placenta, third trimester: Secondary | ICD-10-CM | POA: Diagnosis not present

## 2023-09-09 DIAGNOSIS — Z148 Genetic carrier of other disease: Secondary | ICD-10-CM

## 2023-09-09 DIAGNOSIS — O24419 Gestational diabetes mellitus in pregnancy, unspecified control: Secondary | ICD-10-CM | POA: Diagnosis not present

## 2023-09-09 DIAGNOSIS — O0990 Supervision of high risk pregnancy, unspecified, unspecified trimester: Secondary | ICD-10-CM

## 2023-09-09 DIAGNOSIS — Z98891 History of uterine scar from previous surgery: Secondary | ICD-10-CM | POA: Insufficient documentation

## 2023-09-09 DIAGNOSIS — O99213 Obesity complicating pregnancy, third trimester: Secondary | ICD-10-CM

## 2023-09-09 DIAGNOSIS — O99210 Obesity complicating pregnancy, unspecified trimester: Secondary | ICD-10-CM

## 2023-09-09 DIAGNOSIS — O34219 Maternal care for unspecified type scar from previous cesarean delivery: Secondary | ICD-10-CM | POA: Diagnosis not present

## 2023-09-09 DIAGNOSIS — E669 Obesity, unspecified: Secondary | ICD-10-CM | POA: Diagnosis not present

## 2023-09-09 DIAGNOSIS — O3663X0 Maternal care for excessive fetal growth, third trimester, not applicable or unspecified: Secondary | ICD-10-CM | POA: Diagnosis not present

## 2023-09-09 NOTE — Progress Notes (Signed)
 MFM Note  Joanna Buchanan is currently at 34 weeks and 3 days.  She has been followed due to maternal obesity with a BMI of 53 and diet-controlled gestational diabetes.    The patient reports that she was given a Dexcom continuous glucose monitor.  She has not been using the monitor and has not been monitoring her blood sugars as she claims that she is too busy with her kids.    She reports feeling fetal movements throughout the day.  A biophysical profile performed today was 8/8.  The AFI was 17.07 cm (within normal limits).   The patient was encouraged to use her Dexcom glucose monitor as prescribed to monitor her blood glucose levels.    The increased risk of adverse pregnancy outcomes such as a fetal demise associated with high glucose levels in pregnancy was discussed.  Should the patient remain noncompliant with monitoring her blood sugars, due to potentially uncontrolled gestational diabetes, delivery should be considered at around 37 weeks.    She will return in 1 week for another BPP.    The patient stated that she understood the significance of everything that was discussed with her today and that all of her questions were answered.  A total of 20 minutes was spent counseling and coordinating the care for this patient.  Greater than 50% of the time was spent in direct face-to-face contact.

## 2023-09-13 ENCOUNTER — Telehealth: Payer: Self-pay | Admitting: Dietician

## 2023-09-13 ENCOUNTER — Encounter: Payer: 59 | Admitting: Obstetrics and Gynecology

## 2023-09-13 ENCOUNTER — Other Ambulatory Visit: Payer: Self-pay

## 2023-09-13 ENCOUNTER — Other Ambulatory Visit: Payer: 59

## 2023-09-13 DIAGNOSIS — O24419 Gestational diabetes mellitus in pregnancy, unspecified control: Secondary | ICD-10-CM

## 2023-09-13 NOTE — Telephone Encounter (Signed)
 Patient did not show up to her appointment this am. Called patient who was unaware of her appointment. She is to have CGM training but does not know where it is currently. She agreed to have her appointment rescheduled for next week.  Leita Constable, RD, LDN, CDCES

## 2023-09-16 ENCOUNTER — Ambulatory Visit: Payer: 59

## 2023-09-16 ENCOUNTER — Ambulatory Visit: Payer: 59 | Attending: Obstetrics

## 2023-09-20 ENCOUNTER — Other Ambulatory Visit: Payer: 59

## 2023-09-22 ENCOUNTER — Other Ambulatory Visit: Payer: Self-pay | Admitting: *Deleted

## 2023-09-22 ENCOUNTER — Ambulatory Visit: Payer: 59 | Admitting: *Deleted

## 2023-09-22 ENCOUNTER — Other Ambulatory Visit: Payer: Self-pay

## 2023-09-22 ENCOUNTER — Ambulatory Visit: Payer: 59 | Attending: Obstetrics

## 2023-09-22 ENCOUNTER — Ambulatory Visit: Payer: 59 | Attending: Obstetrics | Admitting: Obstetrics

## 2023-09-22 ENCOUNTER — Encounter: Payer: Self-pay | Admitting: Obstetrics and Gynecology

## 2023-09-22 DIAGNOSIS — O3663X0 Maternal care for excessive fetal growth, third trimester, not applicable or unspecified: Secondary | ICD-10-CM | POA: Diagnosis not present

## 2023-09-22 DIAGNOSIS — Z148 Genetic carrier of other disease: Secondary | ICD-10-CM | POA: Diagnosis not present

## 2023-09-22 DIAGNOSIS — O24410 Gestational diabetes mellitus in pregnancy, diet controlled: Secondary | ICD-10-CM

## 2023-09-22 DIAGNOSIS — O24419 Gestational diabetes mellitus in pregnancy, unspecified control: Secondary | ICD-10-CM | POA: Diagnosis not present

## 2023-09-22 DIAGNOSIS — O99213 Obesity complicating pregnancy, third trimester: Secondary | ICD-10-CM | POA: Insufficient documentation

## 2023-09-22 DIAGNOSIS — Z3A36 36 weeks gestation of pregnancy: Secondary | ICD-10-CM | POA: Diagnosis not present

## 2023-09-22 DIAGNOSIS — O43193 Other malformation of placenta, third trimester: Secondary | ICD-10-CM | POA: Diagnosis not present

## 2023-09-22 DIAGNOSIS — O34219 Maternal care for unspecified type scar from previous cesarean delivery: Secondary | ICD-10-CM | POA: Diagnosis not present

## 2023-09-22 DIAGNOSIS — Z91199 Patient's noncompliance with other medical treatment and regimen due to unspecified reason: Secondary | ICD-10-CM

## 2023-09-22 DIAGNOSIS — O09293 Supervision of pregnancy with other poor reproductive or obstetric history, third trimester: Secondary | ICD-10-CM

## 2023-09-22 DIAGNOSIS — E669 Obesity, unspecified: Secondary | ICD-10-CM

## 2023-09-22 HISTORY — DX: Patient's noncompliance with other medical treatment and regimen due to unspecified reason: Z91.199

## 2023-09-22 NOTE — Progress Notes (Addendum)
MFM Consult Note  Joanna Buchanan is currently at 36 weeks and 2 days. She has been followed due to maternal obesity with a BMI of 53 and unmonitored/uncontrolled gestational diabetes.    The patient reports that she has not been monitoring her blood sugars as she is too busy with her kids.  She reports feeling fetal movements throughout the day.  The overall EFW of 7 pounds 8 ounces measures at the 92nd percentile for her gestational age.  The AFI was 18.46 cm (within normal limits).   A biophysical profile performed today was 10 out of 10 with a reactive NST.  Due to potentially uncontrolled gestational diabetes (as the patient has remained noncompliant with monitoring her blood glucose levels) and the large for gestational age fetus, delivery is recommended at around 37 weeks.   I will contact the second attending to have him schedule delivery for next weekend at 37+ weeks.  She will return in 1 week for an NST.  The patient is happy with the plan for delivery next week and and stated all of her questions were answered.  A total of 20 minutes was spent counseling and coordinating the care for this patient.  Greater than 50% of the time was spent in direct face-to-face contact.

## 2023-09-22 NOTE — Procedures (Signed)
Joanna Buchanan [birth year] [redacted]w[redacted]d  Fetus A Non-Stress Test Interpretation for 09/22/23  Indication:  Obesity, GDM-noncompliant  Fetal Heart Rate A Mode: External Baseline Rate (A): 130 bpm Variability: Moderate Accelerations: 15 x 15 Decelerations: None Multiple birth?: No  Uterine Activity Mode: Palpation Contraction Frequency (min): Occas Contraction Quality: Mild Resting Tone Palpated: Relaxed Resting Time: Adequate  Interpretation (Fetal Testing) Nonstress Test Interpretation: Reactive Comments: Dr. Parke Poisson reviewed tracing.

## 2023-09-23 ENCOUNTER — Encounter: Payer: Self-pay | Admitting: Obstetrics and Gynecology

## 2023-09-23 ENCOUNTER — Telehealth: Payer: Self-pay | Admitting: Obstetrics and Gynecology

## 2023-09-23 ENCOUNTER — Telehealth (HOSPITAL_COMMUNITY): Payer: Self-pay | Admitting: *Deleted

## 2023-09-23 ENCOUNTER — Encounter (HOSPITAL_COMMUNITY): Payer: Self-pay

## 2023-09-23 ENCOUNTER — Telehealth: Payer: Self-pay | Admitting: Family Medicine

## 2023-09-23 DIAGNOSIS — O3660X0 Maternal care for excessive fetal growth, unspecified trimester, not applicable or unspecified: Secondary | ICD-10-CM

## 2023-09-23 HISTORY — DX: Maternal care for excessive fetal growth, unspecified trimester, not applicable or unspecified: O36.60X0

## 2023-09-23 NOTE — Telephone Encounter (Signed)
OB Note Dr. Parke Poisson asked me to schedule her for delivery at 37wks due to her not checking her blood sugars. He stated patient prefers next week.   Request sent to DB to set up for rpt c/s.   I also do not see any OB appointment for her until 1/28. Request sent for OB appt sometime in the next wek  Cornelia Copa MD Attending Center for [employer] (Faculty Practice) 09/22/2023 Time: 1600

## 2023-09-23 NOTE — Patient Instructions (Signed)
Joanna Buchanan  09/23/2023   Your procedure is scheduled on:  10/02/2023  Arrive at 1015 at Entrance C on [address] at [facility]  and [employer]. You are invited to use the FREE valet parking or use the Visitor's parking deck.  Pick up the phone at the desk and dial [phone].  Call this number if you have problems the morning of surgery: [phone]  Remember:   Do not eat food:(After Midnight) Desps de medianoche.  You may drink clear liquids until arrival at __1015___.  Clear liquids means a liquid you can see thru.  It can have color such as Cola or Kool aid.  Tea is OK and coffee as long as no milk or creamer of any kind.  Take these medicines the morning of surgery with A SIP OF WATER:  none   Do not wear jewelry, make-up or nail polish.  Do not wear lotions, powders, or perfumes. Do not wear deodorant.  Do not shave 48 hours prior to surgery.  Do not bring valuables to the hospital.  [facility] is not   responsible for any belongings or valuables brought to the hospital.  Contacts, dentures or bridgework may not be worn into surgery.  Leave suitcase in the car. After surgery it may be brought to your room.  For patients admitted to the hospital, checkout time is 11:00 AM the day of              discharge.      Please read over the following fact sheets that you were given:     Preparing for Surgery

## 2023-09-23 NOTE — Telephone Encounter (Signed)
Preadmission screen  

## 2023-09-23 NOTE — Telephone Encounter (Signed)
We received a message to get patient scheduled for an Urgent appointment,  Called and left patient a detailed message that we have her scheduled for Monday 1-20 at 3:55 pm.

## 2023-09-26 ENCOUNTER — Other Ambulatory Visit: Payer: Self-pay

## 2023-09-26 ENCOUNTER — Ambulatory Visit (INDEPENDENT_AMBULATORY_CARE_PROVIDER_SITE_OTHER): Payer: 59 | Admitting: Family Medicine

## 2023-09-26 ENCOUNTER — Telehealth (HOSPITAL_COMMUNITY): Payer: Self-pay | Admitting: *Deleted

## 2023-09-26 ENCOUNTER — Other Ambulatory Visit (HOSPITAL_COMMUNITY)
Admission: RE | Admit: 2023-09-26 | Discharge: 2023-09-26 | Disposition: A | Discharge status: Home / Self Care | Payer: 59 | Source: Ambulatory Visit | Attending: Family Medicine | Admitting: Family Medicine

## 2023-09-26 VITALS — BP 129/90 | HR 60 | Wt 341.8 lb

## 2023-09-26 DIAGNOSIS — Z8619 Personal history of other infectious and parasitic diseases: Secondary | ICD-10-CM

## 2023-09-26 DIAGNOSIS — O24419 Gestational diabetes mellitus in pregnancy, unspecified control: Secondary | ICD-10-CM | POA: Insufficient documentation

## 2023-09-26 DIAGNOSIS — Z3A36 36 weeks gestation of pregnancy: Secondary | ICD-10-CM

## 2023-09-26 DIAGNOSIS — Z148 Genetic carrier of other disease: Secondary | ICD-10-CM

## 2023-09-26 DIAGNOSIS — O0990 Supervision of high risk pregnancy, unspecified, unspecified trimester: Secondary | ICD-10-CM

## 2023-09-26 DIAGNOSIS — O0993 Supervision of high risk pregnancy, unspecified, third trimester: Secondary | ICD-10-CM | POA: Insufficient documentation

## 2023-09-26 DIAGNOSIS — Z3493 Encounter for supervision of normal pregnancy, unspecified, third trimester: Secondary | ICD-10-CM | POA: Diagnosis not present

## 2023-09-26 DIAGNOSIS — Z5901 Sheltered homelessness: Secondary | ICD-10-CM

## 2023-09-26 DIAGNOSIS — Z98891 History of uterine scar from previous surgery: Secondary | ICD-10-CM

## 2023-09-26 NOTE — Telephone Encounter (Signed)
Preadmission screen  

## 2023-09-27 ENCOUNTER — Encounter (HOSPITAL_COMMUNITY): Payer: Self-pay

## 2023-09-28 LAB — GC/CHLAMYDIA PROBE AMP (~~LOC~~) NOT AT ARMC
Chlamydia: NEGATIVE
Comment: NEGATIVE
Comment: NORMAL
Neisseria Gonorrhea: NEGATIVE

## 2023-09-28 MED ORDER — VALACYCLOVIR HCL 500 MG PO TABS: 500.0000 mg | ORAL_TABLET | Freq: Two times a day (BID) | ORAL | 0 refills | Status: DC

## 2023-09-28 NOTE — Progress Notes (Signed)
   PRENATAL VISIT NOTE  Subjective:  Joanna Buchanan is a 28 y.o. G3P2002 at [redacted]w[redacted]d being seen today for ongoing prenatal care.  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  Patient reports no bleeding, no contractions, no cramping, and no leaking.  Contractions: Not present. Vag. Bleeding: None.  Movement: Present. Denies leaking of fluid.   The following portions of the patient's history were reviewed and updated as appropriate: allergies, current medications, past family history, past medical history, past social history, past surgical history and problem list.   Objective:   Vitals:   09/26/23 1625  BP: (!) 129/90  Pulse: 60  Weight: (!) 341 lb 12.8 oz (155 kg)    Fetal Status: Fetal Heart Rate (bpm): 135   Movement: Present     General:  Alert, oriented and cooperative. Patient is in no acute distress.  Skin: Skin is warm and dry. No rash noted.   Cardiovascular: Normal heart rate noted  Respiratory: Normal respiratory effort, no problems with respiration noted  Abdomen: Soft, gravid, appropriate for gestational age.  Pain/Pressure: Absent     Pelvic: Cervical exam deferred        Extremities: Normal range of motion.  Edema: None  Mental Status: Normal mood and affect. Normal behavior. Normal judgment and thought content.   Assessment and Plan:  Pregnancy: G3P2002 at [redacted]w[redacted]d 1. Supervision of high risk pregnancy, antepartum FHR and BP appropriate today  2. Gestational diabetes mellitus (GDM) in third trimester, gestational diabetes method of control unspecified Patient reports that she  does not check her blood sugars.  Is not planning on starting checking them now.  Scheduled for repeat cesarean delivery on 1/25.  3. Sheltered homelessness Currently living in a hotel but has qualified for home  4. [redacted] weeks gestation of pregnancy (Primary) - GC/Chlamydia probe amp ([place])not at [facility] - Culture, beta strep (group b only)  5. History of herpes genitalis No signs of an outbreak at this time.  Is planning on repeat C-section but will start Valtrex until that time.  6. History of C-section Desires repeat C-section.  Scheduled for 1/25  7. 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 Please refer to After Visit Summary for other counseling recommendations.   No follow-ups on file.  Future Appointments  Date Time Provider Department Center  09/30/2023 10:15 AM [facility] PAT 1 [facility]INDC None  09/30/2023 11:30 AM [facility] US5 [facility]  09/30/2023  1:15 PM [facility] NST [facility]  10/04/2023 11:15 AM Crissie Reese, Mary Sella, MD [facility]    Celedonio Savage, MD

## 2023-09-29 ENCOUNTER — Other Ambulatory Visit: Payer: Self-pay | Admitting: Family Medicine

## 2023-09-29 DIAGNOSIS — Z98891 History of uterine scar from previous surgery: Secondary | ICD-10-CM

## 2023-09-29 LAB — CULTURE, BETA STREP (GROUP B ONLY): Strep Gp B Culture: POSITIVE — AB

## 2023-09-30 ENCOUNTER — Ambulatory Visit: Payer: 59 | Attending: Maternal & Fetal Medicine

## 2023-09-30 ENCOUNTER — Encounter (HOSPITAL_COMMUNITY)
Admission: RE | Admit: 2023-09-30 | Discharge: 2023-09-30 | Disposition: A | Discharge status: Home / Self Care | Payer: 59 | Source: Ambulatory Visit | Attending: Obstetrics and Gynecology | Admitting: Obstetrics and Gynecology

## 2023-09-30 ENCOUNTER — Encounter: Payer: Self-pay | Admitting: Obstetrics and Gynecology

## 2023-09-30 ENCOUNTER — Ambulatory Visit: Payer: 59

## 2023-09-30 DIAGNOSIS — Z3A37 37 weeks gestation of pregnancy: Secondary | ICD-10-CM | POA: Insufficient documentation

## 2023-09-30 DIAGNOSIS — O24419 Gestational diabetes mellitus in pregnancy, unspecified control: Secondary | ICD-10-CM | POA: Insufficient documentation

## 2023-09-30 DIAGNOSIS — Z98891 History of uterine scar from previous surgery: Secondary | ICD-10-CM

## 2023-09-30 DIAGNOSIS — O34219 Maternal care for unspecified type scar from previous cesarean delivery: Secondary | ICD-10-CM | POA: Insufficient documentation

## 2023-09-30 LAB — CBC
HCT: 37.8 % (ref 36.0–46.0)
Hemoglobin: 12.4 g/dL (ref 12.0–15.0)
MCH: 29.3 pg (ref 26.0–34.0)
MCHC: 32.8 g/dL (ref 30.0–36.0)
MCV: 89.4 fL (ref 80.0–100.0)
Platelets: 317 10*3/uL (ref 150–400)
RBC: 4.23 MIL/uL (ref 3.87–5.11)
RDW: 13.7 % (ref 11.5–15.5)
WBC: 7.7 10*3/uL (ref 4.0–10.5)
nRBC: 0 % (ref 0.0–0.2)

## 2023-09-30 LAB — RPR: RPR Ser Ql: NONREACTIVE

## 2023-09-30 LAB — TYPE AND SCREEN
ABO/RH(D): B POS
Antibody Screen: NEGATIVE

## 2023-09-30 LAB — RAPID HIV SCREEN (HIV 1/2 AB+AG)
HIV 1/2 Antibodies: NONREACTIVE
HIV-1 P24 Antigen - HIV24: NONREACTIVE

## 2023-10-01 ENCOUNTER — Encounter (HOSPITAL_COMMUNITY)
Admission: AD | Disposition: A | Discharge status: Home / Self Care | Payer: Self-pay | Source: Home / Self Care | Attending: Family Medicine

## 2023-10-01 ENCOUNTER — Telehealth (HOSPITAL_COMMUNITY): Payer: Self-pay

## 2023-10-01 ENCOUNTER — Encounter (HOSPITAL_COMMUNITY): Payer: Self-pay | Admitting: Family Medicine

## 2023-10-01 ENCOUNTER — Inpatient Hospital Stay (HOSPITAL_COMMUNITY): Payer: Self-pay

## 2023-10-01 ENCOUNTER — Inpatient Hospital Stay (HOSPITAL_COMMUNITY)
Admission: AD | Admit: 2023-10-01 | Discharge: 2023-10-03 | DRG: 787 | Disposition: A | Discharge status: Home / Self Care | Payer: 59 | Attending: Family Medicine | Admitting: Family Medicine

## 2023-10-01 ENCOUNTER — Other Ambulatory Visit: Payer: Self-pay

## 2023-10-01 DIAGNOSIS — O24429 Gestational diabetes mellitus in childbirth, unspecified control: Secondary | ICD-10-CM | POA: Diagnosis not present

## 2023-10-01 DIAGNOSIS — E66813 Obesity, class 3: Secondary | ICD-10-CM | POA: Diagnosis present

## 2023-10-01 DIAGNOSIS — Z3A37 37 weeks gestation of pregnancy: Secondary | ICD-10-CM

## 2023-10-01 DIAGNOSIS — O9962 Diseases of the digestive system complicating childbirth: Secondary | ICD-10-CM | POA: Diagnosis not present

## 2023-10-01 DIAGNOSIS — Z833 Family history of diabetes mellitus: Secondary | ICD-10-CM | POA: Diagnosis not present

## 2023-10-01 DIAGNOSIS — O9832 Other infections with a predominantly sexual mode of transmission complicating childbirth: Secondary | ICD-10-CM | POA: Diagnosis not present

## 2023-10-01 DIAGNOSIS — Z3A00 Weeks of gestation of pregnancy not specified: Secondary | ICD-10-CM | POA: Diagnosis not present

## 2023-10-01 DIAGNOSIS — Z051 Observation and evaluation of newborn for suspected infectious condition ruled out: Secondary | ICD-10-CM | POA: Diagnosis not present

## 2023-10-01 DIAGNOSIS — O3663X0 Maternal care for excessive fetal growth, third trimester, not applicable or unspecified: Secondary | ICD-10-CM | POA: Diagnosis present

## 2023-10-01 DIAGNOSIS — O0990 Supervision of high risk pregnancy, unspecified, unspecified trimester: Secondary | ICD-10-CM

## 2023-10-01 DIAGNOSIS — Z98891 History of uterine scar from previous surgery: Secondary | ICD-10-CM

## 2023-10-01 DIAGNOSIS — O43193 Other malformation of placenta, third trimester: Secondary | ICD-10-CM | POA: Diagnosis not present

## 2023-10-01 DIAGNOSIS — Z8619 Personal history of other infectious and parasitic diseases: Secondary | ICD-10-CM | POA: Diagnosis present

## 2023-10-01 DIAGNOSIS — A6000 Herpesviral infection of urogenital system, unspecified: Secondary | ICD-10-CM | POA: Diagnosis present

## 2023-10-01 DIAGNOSIS — Z148 Genetic carrier of other disease: Secondary | ICD-10-CM

## 2023-10-01 DIAGNOSIS — O34219 Maternal care for unspecified type scar from previous cesarean delivery: Secondary | ICD-10-CM | POA: Diagnosis not present

## 2023-10-01 DIAGNOSIS — O99214 Obesity complicating childbirth: Secondary | ICD-10-CM | POA: Diagnosis present

## 2023-10-01 DIAGNOSIS — O99824 Streptococcus B carrier state complicating childbirth: Secondary | ICD-10-CM | POA: Diagnosis present

## 2023-10-01 DIAGNOSIS — O99324 Drug use complicating childbirth: Secondary | ICD-10-CM | POA: Diagnosis not present

## 2023-10-01 DIAGNOSIS — Z5901 Sheltered homelessness: Secondary | ICD-10-CM | POA: Diagnosis present

## 2023-10-01 DIAGNOSIS — O99820 Streptococcus B carrier state complicating pregnancy: Secondary | ICD-10-CM | POA: Diagnosis not present

## 2023-10-01 DIAGNOSIS — O34211 Maternal care for low transverse scar from previous cesarean delivery: Secondary | ICD-10-CM | POA: Diagnosis not present

## 2023-10-01 DIAGNOSIS — Z2882 Immunization not carried out because of caregiver refusal: Secondary | ICD-10-CM | POA: Diagnosis not present

## 2023-10-01 DIAGNOSIS — O99210 Obesity complicating pregnancy, unspecified trimester: Secondary | ICD-10-CM | POA: Diagnosis present

## 2023-10-01 DIAGNOSIS — Z8249 Family history of ischemic heart disease and other diseases of the circulatory system: Secondary | ICD-10-CM | POA: Diagnosis not present

## 2023-10-01 DIAGNOSIS — K219 Gastro-esophageal reflux disease without esophagitis: Secondary | ICD-10-CM | POA: Diagnosis not present

## 2023-10-01 DIAGNOSIS — O24424 Gestational diabetes mellitus in childbirth, insulin controlled: Secondary | ICD-10-CM | POA: Diagnosis not present

## 2023-10-01 HISTORY — DX: History of uterine scar from previous surgery: Z98.891

## 2023-10-01 LAB — CBC
HCT: 36.7 % (ref 36.0–46.0)
Hemoglobin: 12 g/dL (ref 12.0–15.0)
MCH: 29 pg (ref 26.0–34.0)
MCHC: 32.7 g/dL (ref 30.0–36.0)
MCV: 88.6 fL (ref 80.0–100.0)
Platelets: 315 10*3/uL (ref 150–400)
RBC: 4.14 MIL/uL (ref 3.87–5.11)
RDW: 13.6 % (ref 11.5–15.5)
WBC: 15 10*3/uL — ABNORMAL HIGH (ref 4.0–10.5)
nRBC: 0 % (ref 0.0–0.2)

## 2023-10-01 LAB — GLUCOSE, CAPILLARY
Glucose-Capillary: 101 mg/dL — ABNORMAL HIGH (ref 70–99)
Glucose-Capillary: 92 mg/dL (ref 70–99)

## 2023-10-01 LAB — CREATININE, SERUM
Creatinine, Ser: 0.63 mg/dL (ref 0.44–1.00)
GFR, Estimated: >60 mL/min (ref >60)

## 2023-10-01 SURGERY — Surgical Case
Anesthesia: Spinal | Site: Abdomen

## 2023-10-01 MED ORDER — GABAPENTIN 100 MG PO CAPS
200.0000 mg | ORAL_CAPSULE | Freq: Every day | ORAL | Status: DC
Start: 2023-10-01 — End: 2023-10-03
  Administered 2023-10-01 – 2023-10-02 (×2): 200 mg via ORAL
  Filled 2023-10-01 (×2): qty 2

## 2023-10-01 MED ORDER — SENNOSIDES-DOCUSATE SODIUM 8.6-50 MG PO TABS
2.0000 | ORAL_TABLET | Freq: Every day | ORAL | Status: DC
  Administered 2023-10-02 – 2023-10-03 (×2): 2 via ORAL
  Filled 2023-10-01 (×2): qty 2

## 2023-10-01 MED ORDER — DIBUCAINE (PERIANAL) 1 % EX OINT: 1.0000 | TOPICAL_OINTMENT | CUTANEOUS | Status: DC | PRN

## 2023-10-01 MED ORDER — IBUPROFEN 600 MG PO TABS
600.0000 mg | ORAL_TABLET | Freq: Four times a day (QID) | ORAL | Status: DC
  Administered 2023-10-02 – 2023-10-03 (×3): 600 mg via ORAL
  Filled 2023-10-01 (×3): qty 1

## 2023-10-01 MED ORDER — MORPHINE SULFATE (PF) 0.5 MG/ML IJ SOLN
INTRAMUSCULAR | Status: AC
  Filled 2023-10-01: qty 10

## 2023-10-01 MED ORDER — ENOXAPARIN SODIUM 80 MG/0.8ML IJ SOSY
80.0000 mg | PREFILLED_SYRINGE | INTRAMUSCULAR | Status: DC
  Administered 2023-10-02 – 2023-10-03 (×2): 80 mg via SUBCUTANEOUS
  Filled 2023-10-01 (×2): qty 0.8

## 2023-10-01 MED ORDER — LACTATED RINGERS IV SOLN: INTRAVENOUS | Status: DC | PRN

## 2023-10-01 MED ORDER — OXYTOCIN-SODIUM CHLORIDE 30-0.9 UT/500ML-% IV SOLN: 2.5000 [IU]/h | INTRAVENOUS | Status: DC

## 2023-10-01 MED ORDER — ACETAMINOPHEN 325 MG PO TABS
650.0000 mg | ORAL_TABLET | ORAL | Status: DC | PRN
  Administered 2023-10-01: 650 mg via ORAL
  Filled 2023-10-01: qty 2

## 2023-10-01 MED ORDER — SOD CITRATE-CITRIC ACID 500-334 MG/5ML PO SOLN
30.0000 mL | ORAL | Status: AC
  Administered 2023-10-01: 30 mL via ORAL

## 2023-10-01 MED ORDER — ACETAMINOPHEN 160 MG/5ML PO SOLN
325.0000 mg | ORAL | Status: DC | PRN
Start: 2023-10-01 — End: 2023-10-01

## 2023-10-01 MED ORDER — ZOLPIDEM TARTRATE 5 MG PO TABS: 5.0000 mg | ORAL_TABLET | Freq: Every evening | ORAL | Status: DC | PRN

## 2023-10-01 MED ORDER — MORPHINE SULFATE (PF) 0.5 MG/ML IJ SOLN
INTRAMUSCULAR | Status: DC | PRN
  Administered 2023-10-01: 150 ug via INTRATHECAL

## 2023-10-01 MED ORDER — ACETAMINOPHEN 10 MG/ML IV SOLN
INTRAVENOUS | Status: AC
  Filled 2023-10-01: qty 100

## 2023-10-01 MED ORDER — FENTANYL CITRATE (PF) 100 MCG/2ML IJ SOLN: 25.0000 ug | INTRAMUSCULAR | Status: DC | PRN

## 2023-10-01 MED ORDER — MAGNESIUM HYDROXIDE 400 MG/5ML PO SUSP: 30.0000 mL | ORAL | Status: DC | PRN

## 2023-10-01 MED ORDER — MENTHOL 3 MG MT LOZG: 1.0000 | LOZENGE | OROMUCOSAL | Status: DC | PRN

## 2023-10-01 MED ORDER — SODIUM CHLORIDE 0.9 % IV SOLN
3.0000 g | INTRAVENOUS | Status: AC
  Administered 2023-10-01: 3 g via INTRAVENOUS

## 2023-10-01 MED ORDER — OXYCODONE HCL 5 MG PO TABS
5.0000 mg | ORAL_TABLET | Freq: Once | ORAL | Status: DC | PRN
Start: 2023-10-01 — End: 2023-10-01

## 2023-10-01 MED ORDER — SODIUM CHLORIDE 0.9 % IV SOLN
INTRAVENOUS | Status: AC
  Filled 2023-10-01: qty 3

## 2023-10-01 MED ORDER — SIMETHICONE 80 MG PO CHEW
80.0000 mg | CHEWABLE_TABLET | Freq: Three times a day (TID) | ORAL | Status: DC
  Administered 2023-10-01 – 2023-10-03 (×5): 80 mg via ORAL
  Filled 2023-10-01 (×6): qty 1

## 2023-10-01 MED ORDER — OXYTOCIN-SODIUM CHLORIDE 30-0.9 UT/500ML-% IV SOLN
2.5000 [IU]/h | INTRAVENOUS | Status: AC
  Administered 2023-10-01: 2.5 [IU]/h via INTRAVENOUS
  Filled 2023-10-01 (×2): qty 500

## 2023-10-01 MED ORDER — OXYCODONE HCL 5 MG/5ML PO SOLN: 5.0000 mg | Freq: Once | ORAL | Status: DC | PRN

## 2023-10-01 MED ORDER — MEDROXYPROGESTERONE ACETATE 150 MG/ML IM SUSP: 150.0000 mg | INTRAMUSCULAR | Status: DC | PRN

## 2023-10-01 MED ORDER — OXYCODONE HCL 5 MG PO TABS
5.0000 mg | ORAL_TABLET | ORAL | Status: DC | PRN
  Administered 2023-10-03: 10 mg via ORAL
  Filled 2023-10-01: qty 2

## 2023-10-01 MED ORDER — DIPHENHYDRAMINE HCL 25 MG PO CAPS
25.0000 mg | ORAL_CAPSULE | Freq: Four times a day (QID) | ORAL | Status: DC | PRN
  Administered 2023-10-02 (×2): 25 mg via ORAL
  Filled 2023-10-01 (×2): qty 1

## 2023-10-01 MED ORDER — EPHEDRINE SULFATE-NACL 50-0.9 MG/10ML-% IV SOSY
PREFILLED_SYRINGE | INTRAVENOUS | Status: DC | PRN
  Administered 2023-10-01: 20 mg via INTRAVENOUS
  Administered 2023-10-01 (×2): 10 mg via INTRAVENOUS

## 2023-10-01 MED ORDER — ONDANSETRON HCL 4 MG/2ML IJ SOLN
INTRAMUSCULAR | Status: DC | PRN
  Administered 2023-10-01: 4 mg via INTRAVENOUS

## 2023-10-01 MED ORDER — EPHEDRINE 5 MG/ML INJ
INTRAVENOUS | Status: AC
  Filled 2023-10-01: qty 10

## 2023-10-01 MED ORDER — BUPIVACAINE IN DEXTROSE 0.75-8.25 % IT SOLN
INTRATHECAL | Status: DC | PRN
  Administered 2023-10-01: 1.65 mL via INTRATHECAL

## 2023-10-01 MED ORDER — PRENATAL MULTIVITAMIN CH
1.0000 | ORAL_TABLET | Freq: Every day | ORAL | Status: DC
  Administered 2023-10-02 – 2023-10-03 (×2): 1 via ORAL
  Filled 2023-10-01 (×2): qty 1

## 2023-10-01 MED ORDER — ENOXAPARIN SODIUM 40 MG/0.4ML IJ SOSY: 40.0000 mg | PREFILLED_SYRINGE | INTRAMUSCULAR | Status: DC

## 2023-10-01 MED ORDER — ACETAMINOPHEN 10 MG/ML IV SOLN
INTRAVENOUS | Status: DC | PRN
  Administered 2023-10-01: 1000 mg via INTRAVENOUS

## 2023-10-01 MED ORDER — FENTANYL CITRATE (PF) 100 MCG/2ML IJ SOLN
INTRAMUSCULAR | Status: DC | PRN
  Administered 2023-10-01: 85 ug via INTRAVENOUS

## 2023-10-01 MED ORDER — DIPHENHYDRAMINE HCL 50 MG/ML IJ SOLN
INTRAMUSCULAR | Status: AC
  Filled 2023-10-01: qty 1

## 2023-10-01 MED ORDER — OXYTOCIN-SODIUM CHLORIDE 30-0.9 UT/500ML-% IV SOLN
INTRAVENOUS | Status: DC | PRN
  Administered 2023-10-01: 200 mL via INTRAVENOUS

## 2023-10-01 MED ORDER — WITCH HAZEL-GLYCERIN EX PADS: 1.0000 | MEDICATED_PAD | CUTANEOUS | Status: DC | PRN

## 2023-10-01 MED ORDER — DEXAMETHASONE SODIUM PHOSPHATE 10 MG/ML IJ SOLN
INTRAMUSCULAR | Status: DC | PRN
  Administered 2023-10-01: 4 mg via INTRAVENOUS

## 2023-10-01 MED ORDER — STERILE WATER FOR IRRIGATION IR SOLN
Status: DC | PRN
  Administered 2023-10-01: 1

## 2023-10-01 MED ORDER — PHENYLEPHRINE HCL-NACL 20-0.9 MG/250ML-% IV SOLN
INTRAVENOUS | Status: DC | PRN
  Administered 2023-10-01: 60 ug/min via INTRAVENOUS

## 2023-10-01 MED ORDER — HYDROMORPHONE HCL 2 MG PO TABS: 1.0000 mg | ORAL_TABLET | ORAL | Status: DC | PRN

## 2023-10-01 MED ORDER — KETOROLAC TROMETHAMINE 30 MG/ML IJ SOLN
30.0000 mg | Freq: Four times a day (QID) | INTRAMUSCULAR | Status: AC
  Administered 2023-10-01 – 2023-10-02 (×4): 30 mg via INTRAVENOUS
  Filled 2023-10-01 (×4): qty 1

## 2023-10-01 MED ORDER — PHENYLEPHRINE 80 MCG/ML (10ML) SYRINGE FOR IV PUSH (FOR BLOOD PRESSURE SUPPORT)
PREFILLED_SYRINGE | INTRAVENOUS | Status: AC
  Filled 2023-10-01: qty 10

## 2023-10-01 MED ORDER — DIPHENHYDRAMINE HCL 50 MG/ML IJ SOLN
12.5000 mg | Freq: Once | INTRAMUSCULAR | Status: AC
  Administered 2023-10-01: 12.5 mg via INTRAVENOUS

## 2023-10-01 MED ORDER — ONDANSETRON HCL 4 MG/2ML IJ SOLN
INTRAMUSCULAR | Status: AC
  Filled 2023-10-01: qty 2

## 2023-10-01 MED ORDER — MEPERIDINE HCL 25 MG/ML IJ SOLN: 6.2500 mg | INTRAMUSCULAR | Status: DC | PRN

## 2023-10-01 MED ORDER — LACTATED RINGERS IV SOLN: INTRAVENOUS | Status: AC

## 2023-10-01 MED ORDER — SIMETHICONE 80 MG PO CHEW: 80.0000 mg | CHEWABLE_TABLET | ORAL | Status: DC | PRN

## 2023-10-01 MED ORDER — FENTANYL CITRATE (PF) 100 MCG/2ML IJ SOLN
INTRAMUSCULAR | Status: DC | PRN
  Administered 2023-10-01: 15 ug via INTRATHECAL

## 2023-10-01 MED ORDER — FENTANYL CITRATE (PF) 100 MCG/2ML IJ SOLN
INTRAMUSCULAR | Status: AC
  Filled 2023-10-01: qty 2

## 2023-10-01 MED ORDER — ONDANSETRON HCL 4 MG/2ML IJ SOLN
4.0000 mg | Freq: Once | INTRAMUSCULAR | Status: DC | PRN
Start: 2023-10-01 — End: 2023-10-01

## 2023-10-01 MED ORDER — ACETAMINOPHEN 325 MG PO TABS
325.0000 mg | ORAL_TABLET | ORAL | Status: DC | PRN
Start: 2023-10-01 — End: 2023-10-01

## 2023-10-01 MED ORDER — TETANUS-DIPHTH-ACELL PERTUSSIS 5-2.5-18.5 LF-MCG/0.5 IM SUSY: 0.5000 mL | PREFILLED_SYRINGE | Freq: Once | INTRAMUSCULAR | Status: DC

## 2023-10-01 MED ORDER — TRANEXAMIC ACID-NACL 1000-0.7 MG/100ML-% IV SOLN: 1000.0000 mg | Freq: Once | INTRAVENOUS | Status: DC

## 2023-10-01 MED ORDER — SOD CITRATE-CITRIC ACID 500-334 MG/5ML PO SOLN
ORAL | Status: AC
  Filled 2023-10-01: qty 30

## 2023-10-01 MED ORDER — COCONUT OIL OIL: 1.0000 | TOPICAL_OIL | Status: DC | PRN

## 2023-10-01 MED ORDER — MEASLES, MUMPS & RUBELLA VAC IJ SOLR: 0.5000 mL | Freq: Once | INTRAMUSCULAR | Status: DC

## 2023-10-01 SURGICAL SUPPLY — 29 items
BENZOIN TINCTURE PRP APPL 2/3 (GAUZE/BANDAGES/DRESSINGS) IMPLANT
CHLORAPREP W/TINT 26 (MISCELLANEOUS) ×2 IMPLANT
CLAMP UMBILICAL CORD (MISCELLANEOUS) ×1 IMPLANT
CLOTH BEACON ORANGE TIMEOUT ST (SAFETY) ×1 IMPLANT
DRSG OPSITE POSTOP 4X10 (GAUZE/BANDAGES/DRESSINGS) ×1 IMPLANT
ELECT REM PT RETURN 9FT ADLT (ELECTROSURGICAL) ×1
ELECTRODE REM PT RTRN 9FT ADLT (ELECTROSURGICAL) ×1 IMPLANT
EXTRACTOR VACUUM BELL STYLE (SUCTIONS) IMPLANT
GAUZE SPONGE 4X4 12PLY STRL LF (GAUZE/BANDAGES/DRESSINGS) IMPLANT
GLOVE BIOGEL PI IND STRL 6.5 (GLOVE) ×2 IMPLANT
GLOVE ECLIPSE 6.5 STRL STRAW (GLOVE) ×2 IMPLANT
GOWN STRL REUS W/TWL LRG LVL3 (GOWN DISPOSABLE) ×3 IMPLANT
KIT ABG SYR 3ML LUER SLIP (SYRINGE) IMPLANT
NDL HYPO 25X1 1.5 SAFETY (NEEDLE) IMPLANT
NEEDLE HYPO 22GX1.5 SAFETY (NEEDLE) ×1 IMPLANT
NEEDLE HYPO 25X1 1.5 SAFETY (NEEDLE)
NS IRRIG 1000ML POUR BTL (IV SOLUTION) ×1 IMPLANT
PACK C SECTION WH (CUSTOM PROCEDURE TRAY) ×1 IMPLANT
PAD OB MATERNITY 4.3X12.25 (PERSONAL CARE ITEMS) ×1 IMPLANT
RETRACTOR TRAXI PANNICULUS (MISCELLANEOUS) IMPLANT
STRIP CLOSURE SKIN 1/2X4 (GAUZE/BANDAGES/DRESSINGS) IMPLANT
SUT MON AB 4-0 PS1 27 (SUTURE) ×1 IMPLANT
SUT PLAIN ABS 2-0 CT1 27XMFL (SUTURE) ×1 IMPLANT
SUT VIC AB 0 CT1 36 (SUTURE) ×2 IMPLANT
SUT VIC AB 0 CTX36XBRD ANBCTRL (SUTURE) ×1 IMPLANT
SYR CONTROL 10ML LL (SYRINGE) ×1 IMPLANT
TOWEL OR 17X24 6PK STRL BLUE (TOWEL DISPOSABLE) ×1 IMPLANT
TRAY FOLEY W/BAG SLVR 14FR LF (SET/KITS/TRAYS/PACK) ×1 IMPLANT
WATER STERILE IRR 1000ML POUR (IV SOLUTION) ×1 IMPLANT

## 2023-10-01 NOTE — H&P (Signed)
Obstetric Preoperative History and Physical  Joanna Buchanan is a 28 y.o. [id number] with IUP at [redacted]w[redacted]d presenting for scheduled cesarean section.  No acute concerns. Diagnosed with gestational diabetes but has not been checking blood glucose.  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   Prenatal labs and studies: ABO, Rh: --/--/B POS (01/24 1056) Antibody: NEG (01/24 1056) Rubella: 4.85 (08/20 1126) RPR: NON REACTIVE (01/24 1049)  HBsAg: Negative (08/20 1126)  HIV: NON REACTIVE (01/24 1049)  ZDG:UYQIHKVQ/-- (01/20 1655) 2 hr Glucola  positive Genetic screening normal Anatomy US abnormal with marginal cord insertion  Prenatal Transfer Tool  Fetal Ultrasounds or other Referrals:  Referred to Materal Fetal Medicine  Maternal Substance Abuse:  No Significant Maternal Medications:  None Significant Maternal Lab Results: Group B Strep positive  Past Medical History:  Diagnosis Date   GERD (gastroesophageal reflux disease)    Gestational diabetes    Gestational diabetes mellitus (GDM) affecting pregnancy, antepartum 03/30/2022   Current Diabetic Medications:  None  [ ]  Aspirin 81 mg daily after 12 weeks (? A2/B GDM)  Required Referrals for A1GDM or A2GDM: [ ]  Diabetes Education and Testing  Supplies [ ]  Nutrition Cousult  For A2/B GDM or higher classes of DM [ ]  Diabetes Education and Testing Supplies [ ]  Nutrition Counsult [ ]  Fetal ECHO after 20 weeks  [ ]  Eye exam for retina evaluation   Baseline and surveillance labs (   Herpes, genital    Medical history non-contributory     Past Surgical History:  Procedure Laterality Date   CESAREAN SECTION     CESAREAN SECTION N/A 05/19/2022   Procedure: CESAREAN SECTION;  Surgeon: Federico Flake, MD;  Location: [facility] LD ORS;  Service: Obstetrics;  Laterality: N/A;   TONSILLECTOMY      OB History  Gravida Para Term Preterm AB Living  3 2 2  0 0 2  SAB IAB Ectopic Multiple Live Births  0 0 0 0 2    # Outcome Date GA Lbr Len/2nd Weight Sex Type Anes PTL Lv  3 Current           2 Term 05/19/22 [redacted]w[redacted]d  2990 g F CS-Vac Spinal  LIV     Birth Comments: Early term female with intermittent grunting; improved after PPV/CPAP.GDM  1 Term 2019 [redacted]w[redacted]d  2722 g  CS-LTranv EPI  LIV     Birth Comments: for HSV outbreak    Social History   Socioeconomic History   Marital status: Single    Spouse name: Not on file   Number of children: Not on file   Years of education: Not on file   Highest education level: Not on file  Occupational History   Not on file  Tobacco Use   Smoking status: Never   Smokeless tobacco: Never  Vaping Use  Vaping status: Never Used  Substance and Sexual Activity   Alcohol use: Never   Drug use: Yes    Types: Marijuana    Comment: 1.17   Sexual activity: Yes    Birth control/protection: None  Other Topics Concern   Not on file  Social History Narrative   Not on file   Social Drivers of Health   Financial Resource Strain: Not on File (12/30/2021)   Received from [employer], [place] Strain    Financial Resource Strain: 0  Food Insecurity: Food Insecurity Present (10/01/2023)   Hunger Vital Sign    Worried About Running Out of Food in the Last Year: Sometimes true    Ran Out of Food  in the Last Year: Sometimes true  Transportation Needs: No Transportation Needs (10/01/2023)   PRAPARE - Administrator, Civil Service (Medical): No    Lack of Transportation (Non-Medical): No  Physical Activity: Not on File (12/30/2021)   Received from [place], [place]   Physical Activity    Physical Activity: 0  Stress: Not on File (12/30/2021)   Received from [facility], [place]   Stress    Stress: 0  Social Connections: Unknown (10/01/2023)   Social Connection and Isolation Panel [NHANES]    Frequency of Communication with Friends and Family: Patient declined    Frequency of Social Gatherings with Friends and Family: Patient declined    Attends Religious Services: Not on Marketing executive or Organizations: Patient declined    Attends Engineer, structural: Not on file    Marital Status: Patient declined    Family History  Problem Relation Age of Onset   Diabetes Mother    Hypertension Mother    Asthma Mother    Multiple sclerosis Mother    Cancer Maternal Aunt    Diabetes Maternal Grandmother    Birth defects Neg Hx    Heart disease Neg Hx    Stroke Neg Hx     Medications Prior to Admission  Medication Sig Dispense Refill Last Dose/Taking   Accu-Chek Softclix Lancets lancets Use 4 times daily as instructed (Patient not taking: Reported on 07/25/2023) 100 each 12    Blood Glucose Monitoring Suppl (ACCU-CHEK GUIDE) w/Device KIT Use 4 times daily as directed (Patient not taking: Reported on 07/25/2023) 1 kit 0    Continuous Glucose Receiver (DEXCOM G7 RECEIVER) DEVI 1 Units by Does not apply route once a week. (Patient not taking: Reported on 08/23/2023) 3 each 6    Continuous Glucose Sensor (DEXCOM G7 SENSOR) MISC 1 Units by Does not apply route once a week. (Patient not taking: Reported on 08/23/2023) 3 each 6    glucose blood test strip Use 4 times daily as instructed (Patient not taking: Reported on 07/25/2023) 100 each 12    Spacer/Aero-Holding  Chambers (AEROCHAMBER PLUS) Device Use as directed with inhaler (Patient not taking: Reported on 09/09/2023) 1 each 1    valACYclovir (VALTREX) 500 MG tablet Take 1 tablet (500 mg total) by mouth 2 (two) times daily. 60 tablet 0     Allergies  Allergen Reactions   Other Anaphylaxis, Shortness Of Breath and Swelling    Avocado, watermelon, cherries, bananas, oranges cause throat swelling   Shellfish Allergy Itching    Review of Systems: Negative except for what is mentioned in HPI.  Physical Exam: BP 125/79   Pulse 89   Temp 98.5 F (36.9 C) (Oral)   Resp 18  Ht 5\' 8"  (1.727 m)   Wt (!) 156.4 kg   LMP 01/11/2023   SpO2 96%   BMI 52.44 kg/m  FHR by Doppler: 130 bpm CONSTITUTIONAL: Well-developed, well-nourished female in no acute distress.  HENT:  Normocephalic, atraumatic, External right and left ear normal. Oropharynx is clear and moist EYES: Conjunctivae and EOM are normal. Pupils are equal, round, and reactive to light. No scleral icterus.  NECK: Normal range of motion, supple, no masses SKIN: Skin is warm and dry. No rash noted. Not diaphoretic. No erythema. No pallor. NEUROLGIC: Alert and oriented to person, place, and time. Normal reflexes, muscle tone coordination. No cranial nerve deficit noted. PSYCHIATRIC: Normal mood and affect. Normal behavior. Normal judgment and thought content. CARDIOVASCULAR: Normal heart rate noted, regular rhythm RESPIRATORY: Effort and breath sounds normal, no problems with respiration noted ABDOMEN: Soft, nontender, nondistended, gravid. Well-healed Pfannenstiel incision. PELVIC: Deferred MUSCULOSKELETAL: Normal range of motion. No edema and no tenderness.   Pertinent Labs/Studies:   Results for orders placed or performed during the hospital encounter of 10/01/23 (from the past 72 hours)  Glucose, capillary     Status: Abnormal   Collection Time: 10/01/23 10:53 AM  Result Value Ref Range   Glucose-Capillary 101 (H) 70 - 99 mg/dL     Comment: Glucose reference range applies only to samples taken after fasting for at least 8 hours.    Assessment and Plan :Joanna Buchanan is a 28 y.o. [id number] at [redacted]w[redacted]d being admitted for scheduled repeat cesarean section. No acute issues today. Diagnosed with gestational diabetes but has not been checking blood glucose during pregnancy, with LGA baby (EFW> 99%), likely uncontrolled hence recommendation for delivery at 37 weeks by MFM.  The risks of cesarean section were discussed with the patient; including but not limited to: infection which may require antibiotics; bleeding which may require transfusion or re-operation; injury to bowel, bladder, ureters or other surrounding organs; injury to the fetus; need for additional procedures including hysterectomy in the event of a life-threatening hemorrhage; placental abnormalities wth subsequent pregnancies,  risk of needing c-sections in future pregnancies, incisional problems, thromboembolic phenomenon and other postoperative/anesthesia complications. Answered all questions. The patient verbalized understanding of the plan, giving informed consent for the procedure. She is agreeable to blood transfusion in the event of emergency.  Patient has been NPO since midnight, she will remain NPO for procedure Anesthesia and OR aware Preoperative prophylactic antibiotics and SCDs ordered on call to the OR  To OR when ready   K. Therese Sarah, M.D. Attending Obstetrician & Gynecologist, [facility] for [employer], [facility]

## 2023-10-01 NOTE — Discharge Summary (Signed)
error 

## 2023-10-01 NOTE — Anesthesia Procedure Notes (Signed)
Spinal  Patient location during procedure: OR Start time: 10/01/2023 11:40 AM End time: 10/01/2023 11:46 AM Reason for block: surgical anesthesia Staffing Anesthesiologist: Bethena Midget, MD Performed by: Bethena Midget, MD Authorized by: Bethena Midget, MD   Preanesthetic Checklist Completed: patient identified, IV checked, site marked, risks and benefits discussed, surgical consent, monitors and equipment checked, pre-op evaluation and timeout performed Spinal Block Patient position: sitting Prep: DuraPrep Patient monitoring: heart rate, cardiac monitor, continuous pulse ox and blood pressure Approach: midline Location: L3-4 Injection technique: single-shot Needle Needle type: Sprotte  Needle gauge: 24 G Needle length: 9 cm Assessment Sensory level: T4 Events: CSF return Additional Notes Multiple attempts

## 2023-10-01 NOTE — Lactation Note (Signed)
This note was copied from a baby's chart. Lactation Consultation Note  Patient Name: Joanna Buchanan [id number] Date: 10/01/2023 Age:26 hours Reason for consult: Initial assessment;Early term 37-38.6wks. See MOB- MR hx of GDM and C/S delivery.  Per MOB, infant is latching well on her right breast but will not latch on her left breast she would like latch assistance. MOB briefly BF 2nd child but stopped due to latch difficulties, she really wants to BF her 3rd child and not pump only. LC did not observe latch due infant recently BF for 20 minutes at 1936 pm. MOB was concern if she has colostrum. LC reviewed hand expression and MOB easily expressed 5 mls of colostrum in a spoon that was spoon fed to infant. [facility] written name on white board, MOB will call for latch assistance from [facility] services for next feeding to assist with latching infant on her left breast. MOB will continue to BF infant by cues, on demand, every 2-3 hours, skin to skin. LC discussed the importance of maternal rest, hydration and balance meals and snacks. LC discussed infant's input and output and infant had one stool since birth.  MOB was  made aware of O/P services, breastfeeding support groups, community resources, and our phone # for post-discharge questions.    LC sent referral for STORK DEBP tonight.  Maternal Data Has patient been taught Hand Expression?: Yes Does the patient have breastfeeding experience prior to this delivery?: Yes How long did the patient breastfeed?: Per MOB, had latch difficulties with 2nd child, she exclusively pumped for 2 months until milk supply decreased.  Feeding Mother's Current Feeding Choice: Breast Milk  LATCH Score  LC did not observe latch due to infant recently BF for 20 minutes.                   Lactation Tools Discussed/Used    Interventions Interventions: Breast feeding basics reviewed;Skin to skin;LC Services brochure  Discharge Pump:  (LC sent STORK DEBP referal  tonight.)  Consult Status Consult Status: Follow-up Date: 10/02/23 Follow-up type: In-patient    Frederico Hamman 10/01/2023, 8:20 PM

## 2023-10-01 NOTE — Transfer of Care (Signed)
Immediate Anesthesia Transfer of Care Note  Patient: Orthoptist  Procedure(s) Performed: CESAREAN SECTION (Abdomen)  Patient Location: PACU  Anesthesia Type:Spinal  Level of Consciousness: awake, alert , and oriented  Airway & Oxygen Therapy: Patient Spontanous Breathing  Post-op Assessment: Report given to RN and Post -op Vital signs reviewed and stable  Post vital signs: Reviewed and stable  Last Vitals:  Vitals Value Taken Time  BP 110/67 10/01/23 1306  Temp    Pulse 83 10/01/23 1309  Resp 24 10/01/23 1309  SpO2 99 % 10/01/23 1309  Vitals shown include unfiled device data.  Last Pain:  Vitals:   10/01/23 1045  TempSrc: Oral  PainSc: 7          Complications: No notable events documented.

## 2023-10-01 NOTE — Progress Notes (Signed)
No hepatitis B

## 2023-10-01 NOTE — Anesthesia Postprocedure Evaluation (Signed)
Anesthesia Post Note  Patient: Orthoptist  Procedure(s) Performed: CESAREAN SECTION (Abdomen)     Patient location during evaluation: PACU Anesthesia Type: Spinal Level of consciousness: oriented and awake and alert Pain management: pain level controlled Vital Signs Assessment: post-procedure vital signs reviewed and stable Respiratory status: spontaneous breathing, respiratory function stable and patient connected to nasal cannula oxygen Cardiovascular status: blood pressure returned to baseline and stable Postop Assessment: no headache, no backache and no apparent nausea or vomiting Anesthetic complications: no   No notable events documented.  Last Vitals:  Vitals:   10/01/23 1443 10/01/23 1544  BP: 90/72 126/71  Pulse: 72 78  Resp: 16   Temp: 37 C 36.9 C  SpO2: 99% 99%    Last Pain:  Vitals:   10/01/23 1644  TempSrc:   PainSc: 0-No pain   Pain Goal:                Epidural/Spinal Function Cutaneous sensation: Normal sensation (10/01/23 1644), Patient able to flex knees: Yes (10/01/23 1644), Patient able to lift hips off bed: Yes (10/01/23 1644), Back pain beyond tenderness at insertion site: No (10/01/23 1644), Progressively worsening motor and/or sensory loss: No (10/01/23 1644), Bowel and/or bladder incontinence post epidural: No (10/01/23 1644)  Sricharan Lacomb

## 2023-10-01 NOTE — Op Note (Signed)
Joanna Buchanan PROCEDURE DATE: 10/01/2023  PREOPERATIVE DIAGNOSES: Intrauterine pregnancy at [redacted]w[redacted]d weeks gestation; previous uterine incision x2  POSTOPERATIVE DIAGNOSES: The same  PROCEDURE: Repeat Low Transverse Cesarean Section  SURGEON:  Baldemar Lenis, MD  ASSISTANT:  Dr. Wyn Forster  An experienced assistant was required given the standard of surgical care given the complexity of the case.  This assistant was needed for exposure, dissection, suctioning, retraction, instrument exchange, assisting with delivery with administration of fundal pressure, and for overall help during the procedure.  ANESTHESIOLOGY TEAM: Anesthesiologist: Bethena Midget, MD CRNA: Rica Records, CRNA  INDICATIONS: Joanna Buchanan is a 28 y.o. [phone] at [redacted]w[redacted]d here for cesarean section secondary to the indications listed under preoperative diagnoses; please see preoperative note for further details.  The risks of cesarean section were discussed with the patient including but were not limited to: bleeding which may require transfusion or reoperation; infection which may require antibiotics; injury to bowel, bladder, ureters or other surrounding organs; injury to the fetus; need for additional procedures including hysterectomy in the event of a life-threatening hemorrhage; placental abnormalities wth subsequent pregnancies, incisional problems, thromboembolic phenomenon and other postoperative/anesthesia complications.  She consents to blood transfusion in the event of an emergency. The patient verbalized understanding of the plan, giving informed written consent for the procedure.    FINDINGS:  Viable female infant in cephalic presentation.  Apgars 8 and 9. Weight: 3190 gm. Dense adhesions in the suprafascial space. One omental adhesion to anterior abdominal wall on left. Clear amniotic fluid.  Intact placenta, three vessel cord.  Otherwise normal uterus, fallopian tubes and ovaries  bilaterally.  ANESTHESIA: Spinal INTRAVENOUS FLUIDS: 1500 ml   ESTIMATED BLOOD LOSS: 284 ml URINE OUTPUT:  100 ml SPECIMENS: Placenta sent to L&D COMPLICATIONS: None immediate  PROCEDURE IN DETAIL:  The patient preoperatively received intravenous antibiotics and had sequential compression devices applied to her lower extremities.  She was then taken to the operating room where spinal anesthesia was administered and was found to be adequate. She was then placed in a dorsal supine position with a leftward tilt, and a Traxi retraction device was placed to good effect on the pannus/abdomen. She was prepped and draped in a sterile manner.  A foley catheter was placed into her bladder with sterile technique and attached to constant gravity.  After a timeout was performed, a Pfannenstiel skin incision was made with scalpel over her preexisting scar and carried through to the underlying layer of fascia. The fascia was incised in the midline, and this incision was extended bilaterally using the Mayo scissors.  Kocher clamps were applied to the superior aspect of the fascial incision and the underlying rectus muscles were dissected off sharply.  A similar process was carried out on the inferior aspect of the fascial incision. The rectus muscles were separated in the midline sharply and the peritoneum was entered sharply while dissecting the muscle off the fascia. The peritoneal incision was carefully extended sharply laterally and caudad with good visualization of the bladder. The uterus appeared normal. The Alexis O-ring retractor was placed into the incision, taking care not to incorporate bowel or omentum. The bladder blade was inserted. Attention was turned to the lower uterine segment where a low transverse hysterotomy was made with a scalpel and extended bilaterally bluntly.  The infant was delivered from cephalic position, nose and mouth were bulb suctioned, and the cord clamped and cut after 1 minute. The  infant was then handed over to the waiting neonatology team. Uterine massage  was then performed, and the placenta delivered intact with a three-vessel cord. The uterus was then cleared of clots and debris.  The hysterotomy was closed with 0 Vicryl in a running locked fashion, and an imbricating layer was also placed with 0 Vicryl. Figure-of-eight 0 Vicryl serosal stitches were placed to help with hemostasis.  The fallopian tubes and ovaries were visualized bilaterally and normal appearing. The Alexis retractor was removed.  The pelvis was cleared of all clot and debris. Hemostasis was again confirmed on all surfaces. The fascia was then closed using 0 PDS in a running fashion.  The subcutaneous layer was irrigated, then reapproximated with 2-0 plain gut.  The skin was closed with a 4-0 Monocryl subcuticular stitch. The patient tolerated the procedure well. Sponge, lap, instrument and needle counts were correct x 3.  She was taken to the recovery room in stable condition.    Baldemar Lenis, M.D. Attending Obstetrician & Gynecologist, [facility] for [employer], [facility]

## 2023-10-01 NOTE — Anesthesia Preprocedure Evaluation (Addendum)
Anesthesia Evaluation  Patient identified by MRN, date of birth, ID band Patient awake    Reviewed: Allergy & Precautions, H&P , NPO status , Patient's Chart, lab work & pertinent test results  History of Anesthesia Complications Negative for: history of anesthetic complications  Airway Mallampati: II  TM Distance: >3 FB     Dental  (+) Teeth Intact, Dental Advisory Given   Pulmonary neg pulmonary ROS, Patient abstained from smoking.   Pulmonary exam normal        Cardiovascular negative cardio ROS  Rhythm:regular Rate:Normal     Neuro/Psych negative neurological ROS  negative psych ROS   GI/Hepatic Neg liver ROS,GERD  ,,  Endo/Other  diabetes, Gestational  Class 3 obesity  Renal/GU negative Renal ROS  negative genitourinary   Musculoskeletal   Abdominal   Peds  Hematology negative hematology ROS (+)   Anesthesia Other Findings   Reproductive/Obstetrics (+) Pregnancy                             Anesthesia Physical Anesthesia Plan  ASA: 3  Anesthesia Plan: Spinal   Post-op Pain Management: Minimal or no pain anticipated   Induction: Intravenous  PONV Risk Score and Plan: Ondansetron, Treatment may vary due to age or medical condition and Scopolamine patch - Pre-op  Airway Management Planned: Natural Airway  Additional Equipment: None  Intra-op Plan:   Post-operative Plan:   Informed Consent: I have reviewed the patients History and Physical, chart, labs and discussed the procedure including the risks, benefits and alternatives for the proposed anesthesia with the patient or authorized representative who has indicated his/her understanding and acceptance.       Plan Discussed with: Anesthesiologist and CRNA  Anesthesia Plan Comments:         Anesthesia Quick Evaluation

## 2023-10-02 LAB — CBC
HCT: 29.3 % — ABNORMAL LOW (ref 36.0–46.0)
Hemoglobin: 9.7 g/dL — ABNORMAL LOW (ref 12.0–15.0)
MCH: 29.8 pg (ref 26.0–34.0)
MCHC: 33.1 g/dL (ref 30.0–36.0)
MCV: 89.9 fL (ref 80.0–100.0)
Platelets: 255 10*3/uL (ref 150–400)
RBC: 3.26 MIL/uL — ABNORMAL LOW (ref 3.87–5.11)
RDW: 13.7 % (ref 11.5–15.5)
WBC: 12.9 10*3/uL — ABNORMAL HIGH (ref 4.0–10.5)
nRBC: 0 % (ref 0.0–0.2)

## 2023-10-02 MED ORDER — FERROUS SULFATE 325 (65 FE) MG PO TABS
325.0000 mg | ORAL_TABLET | ORAL | Status: DC
  Administered 2023-10-02: 325 mg via ORAL
  Filled 2023-10-02: qty 1

## 2023-10-02 NOTE — Progress Notes (Signed)
OB/GYN Faculty Attending Note  Post Op Day 1  Subjective: Patient is feeling well. She reports well controlled pain on PO pain meds. She is ambulating and denies light-headedness or dizziness. She is voiding spontaneously. She is not passing flatus. She is tolerating a regular diet without nausea/vomiting. Bleeding is moderate. She is breast & bottle feeding. Baby is in nursery and doing well.  Objective: Blood pressure 110/68, pulse 64, temperature 98.2 F (36.8 C), temperature source Oral, resp. rate 17, height 5\' 8"  (1.727 m), weight (!) 156.4 kg, last menstrual period 01/11/2023, SpO2 100%, unknown if currently breastfeeding. Temp:  [97.9 F (36.6 C)-98.6 F (37 C)] 98.2 F (36.8 C) (01/26 0519) Pulse Rate:  [64-88] 64 (01/26 0519) Resp:  [14-32] 17 (01/26 0519) BP: (87-126)/(63-79) 110/68 (01/26 0519) SpO2:  [95 %-100 %] 100 % (01/26 0519)  Physical Exam:  General: alert, oriented, cooperative Chest: normal respiratory effort Heart: regular rate  Abdomen: soft, appropriately tender to palpation, incision covered by dressing with no evidence of active bleeding  Uterine Fundus: firm, at the level of the umbilicus Lochia: moderate, rubra DVT Evaluation: no evidence of DVT Extremities: no edema, no calf tenderness  UOP: voiding spontaneously  Recent Labs    10/01/23 1546 10/02/23 0209  HGB 12.0 9.7*  HCT 36.7 29.3*    Assessment/Plan: Patient Active Problem List   Diagnosis Date Noted   S/P repeat low transverse C-section 10/01/2023   LGA (large for gestational age) fetus affecting management of mother 09/23/2023   Non-compliance 09/22/2023   Sheltered homelessness 08/28/2023   Prediabetes 04/28/2023   Drug use complicating pregnancy 04/26/2023   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 [id number] POD#1 s/p RCS at [redacted]w[redacted]d for uncontrolled gDM. She is doing well, recovering appropriately.   SW consult today for being unhoused during pregnancy Continue routine post partum care Pain meds prn Regular diet Lovenox 40 mg daily [place] nothing for birth control Plan for discharge tomorrow    K. Therese Sarah, MD, [facility] Attending Center for [employer] (Faculty Practice)  10/02/2023, 11:24 AM

## 2023-10-02 NOTE — Lactation Note (Signed)
This note was copied from a baby's chart. Lactation Consultation Note  Patient Name: Joanna Buchanan [id number] Date: 10/02/2023 Age:26 hours Reason for consult: Follow-up assessment;Early term 37-38.6wks;Infant weight loss;Maternal endocrine disorder  P3- MOB reports that infant is nursing well and she is very happy because her other children would not latch. MOB reports that her nipples are sore, but denies any tissue breakdown. MOB also reports that her breasts are starting to feel full, but they are not engorged. LC reviewed engorgement/breast care just in case her milk comes in tonight/tomorrow morning. MOB denies having any questions at this time. LC encouraged MOB to call for further assistance as needed. MOB is still waiting for her Stork pump to be delivered.  Maternal Data Has patient been taught Hand Expression?: Yes Does the patient have breastfeeding experience prior to this delivery?: Yes How long did the patient breastfeed?: a few days  Feeding Mother's Current Feeding Choice: Breast Milk  Lactation Tools Discussed/Used Pump Education: Milk Storage  Interventions Interventions: Breast feeding basics reviewed;Education;LC Services brochure  Discharge Discharge Education: Warning signs for feeding baby;Engorgement and breast care Pump:  (Waiting on Stork pump, referral sent 10/01/2023 by Zella Ball)  Consult Status Consult Status: Follow-up Date: 10/03/23 Follow-up type: In-patient    Dema Severin BS, IBCLC 10/02/2023, 8:37 PM

## 2023-10-02 NOTE — Progress Notes (Signed)
This nurse assisted patient to bathroom and  patient passed a softball sized clot. Patient denied feeling dizzy or lightheaded. VS WDL. Fundus firm and midline. EBL 245. STAT CBC ordered per MD Chubb.

## 2023-10-02 NOTE — Clinical Social Work Maternal (Signed)
 CLINICAL SOCIAL WORK MATERNAL/CHILD NOTE  Patient Details  Name: Joanna Buchanan MRN: [id number] Date of Birth: 08/21/1996  Date:  Dec 15, 2023  Clinical Social Worker Initiating Note:  Albertine Patricia, LCSWA Date/Time: Initiated:  10/02/23/1827     Child's Name:  Amanda Cockayne   Biological Parents:  Mother, Father (FOB: Larrie Lucia, DOB: 12/28/1991)   Need for Interpreter:  None   Reason for Referral:  Homelessness, Current Substance Use/Substance Use During Pregnancy     Address:  [address] [place] [address]  (Mailing Address)  MOB is currently residing at [facility], [place], [place] [address]).   Phone number:  [phone] (home)     Additional phone number:   Household Members/Support Persons (HM/SP):   Household Member/Support Person 2, Household Member/Support Person 3, Household Member/Support Person 1   HM/SP Name Relationship DOB or Age  HM/SP -1 Velvie Thomaston FOB/Significant Other 12/28/1991  HM/SP -2 Dalia Serena Daughter 05/19/2022  HM/SP -3 Saige Goode-Spells Daughter 07/08/2018  HM/SP -4        HM/SP -5        HM/SP -6        HM/SP -7        HM/SP -8          Natural Supports (not living in the home):  Immediate Family   Professional Supports: Case Research officer, political party (Caseworker Health visitor through [employer] and Environmental health practitioner through [employer])   Employment: Unemployed   Type of Work:     Education:  9 to 11 years (11th grade)   Homebound arranged:    Surveyor, quantity Resources:  Medicaid, Media planner    Other Resources:  Sales executive     Cultural/Religious Considerations Which May Impact Care:  None identified  Strengths:  Home prepared for child     Psychotropic Medications:         Pediatrician:     Undecided, list provided.  Pediatrician List:   [facility]       Pediatrician Fax Number:    Risk Factors/Current Problems:  Substance Use  , Basic Needs     Cognitive State:  Able to Concentrate  , Linear Thinking  , Alert  , Goal Oriented     Mood/Affect:  Calm  , Comfortable  , Relaxed  , Interested     CSW Assessment: CSW was consulted due to housing concerns and THC use during pregnancy. CSW met with MOB at bedside to complete assessment. When CSW entered room, MOB was observed sitting in hospital bed. FOB was sitting in chair nearby holding infant. CSW introduced self and requested to speak with MOB alone. MOB provided verbal consent to complete consult with FOB present. CSW explained reason for consult. MOB presented as calm, was agreeable to consult and remained engaged throughout encounter.   CSW inquired about address on file. MOB reports that she, FOB, and her two other children have been staying at [facility] [place], [place] [address]) for the past 2 weeks, which has been paid for by the [employer]. MOB reports that she, FOB, and her two children have been primarily living out of their car since July, 2024. MOB reports they have stayed at hotels here and there and also have utilized the [facility] when it became  too cold to sleep in their car. MOB reports around Christmastime, she became involved with the [employer] Program through the [facility] and was connected with two case managers, Environmental health practitioner ([facility]/[employer]) and Health visitor [employer]). MOB states that she has secured an apartment through [employer] that will be ready for the family to move into November 05, 2023. MOB states she is hopeful that the [employer] Program will be able to provide a hotel for her and her family until they are able to move into their apartment.   CSW inquired about MOB's mental health history. MOB denied a history of mental health symptoms/diagnoses and denied a history  of postpartum depression after the birth of her other two children. MOB shared that considering how much stress she has gone through regarding housing during her pregnancy, she feels that she has remained "so positive." MOB reports feeling happy that she and her family are finally securing housing and shares that while her situation has been stressful, she has hope and faith now that she has been accepted into the rapid rehousing program. MOB shares that the program will provide assistance with her first 3 months of rent and cover her deposit for her apartment. CSW commended MOB for her efforts in securing housing and provided encouraging words.   CSW inquired about MOB's supports. MOB shared that her mother was staying with her but this led to problems so she has distanced herself from her mother. MOB states that she has family that she can call for support including her dad and sister who lives in [place], [place]. MOB reports her sister is helping care for her two daughters while she is in the hospital. CSW assessed for safety. MOB denied current SI/HI. DV was not assessed due to FOB being present.  CSW provided education regarding the baby blues period vs. perinatal mood disorders, discussed treatment and gave resources for mental health follow up if concerns arise.  CSW recommends self-evaluation during the postpartum time period using the New Mom Checklist from Postpartum Progress and encouraged MOB to contact a medical professional if symptoms are noted at any time.    MOB reports she has all needed items for infant, including a car seat and bassinet. MOB reports she is undecided on a pediatrician for infant, CSW provided a list. MOB reports she receives food stamps and plans to call [facility] now that infant has been born. MOB declined a referral to [facility], stating she prefers to call. CSW also provided MOB with information about [employer] family market. CSW inquired about transportation. MOB reports  she has transportation. CSW inquired about additional resource needs, MOB declined further needs.  CSW informed MOB about hospital drug screen policy due to reported THC use during pregnancy. CSW explained that infant's UDS resulted positive for marijuana and CDS would be monitored. CSW explained that a CPS report would be made due to infant's UDS resulting positive for marijuana. MOB expressed understanding. CSW inquired about substance use during pregnancy. MOB acknowledged that she smoked marijuana during pregnancy, stating her last use was about 1 week ago. MOB reports daily use of marijuana to relieve stress and to help her eat, as she shared she did not have an appetite. CSW inquired about prior CPS involvement. MOB reports that CPS came to her home after her daughter Hedda Slade was born due to her daughter testing positive for marijuana at birth. MOB reports CPS completed a home visit and closed the case. MOB denies current CPS involvement.  CSW provided review of Sudden Infant Death Syndrome (SIDS) precautions.    CSW placed call to [facility] CPS line and made CPS report to social worker Doylene Canning due to infant's UDS resulting positive for THC.  CSW awaiting determination from CPS if report is screened in/out.  CSW Plan/Description:  Sudden Infant Death Syndrome (SIDS) Education, Perinatal Mood and Anxiety Disorder (PMADs) Education, Hospital Drug Screen Policy Information, Other Information/Referral to [employer], Child Protective Service Report  , CSW Awaiting CPS Disposition Plan, CSW Will Continue to Monitor Umbilical Cord Tissue Drug Screen Results and Make Report if Reggie Pile, LCSWA 10/02/2023, 6:32 PM

## 2023-10-03 ENCOUNTER — Other Ambulatory Visit (HOSPITAL_COMMUNITY): Payer: Self-pay

## 2023-10-03 ENCOUNTER — Ambulatory Visit (HOSPITAL_COMMUNITY): Payer: Self-pay

## 2023-10-03 ENCOUNTER — Encounter (HOSPITAL_COMMUNITY): Payer: Self-pay | Admitting: Obstetrics and Gynecology

## 2023-10-03 LAB — BIRTH TISSUE RECOVERY COLLECTION (PLACENTA DONATION)

## 2023-10-03 MED ORDER — OXYCODONE-ACETAMINOPHEN 5-325 MG PO TABS
1.0000 | ORAL_TABLET | ORAL | 0 refills | Status: DC | PRN
  Filled 2023-10-03: qty 20, 4d supply, fill #0

## 2023-10-03 MED ORDER — PRENATAL MULTIVITAMIN CH
1.0000 | ORAL_TABLET | Freq: Every day | ORAL | 1 refills | Status: DC
  Filled 2023-10-03: qty 100, 100d supply, fill #0

## 2023-10-03 MED ORDER — IBUPROFEN 600 MG PO TABS
600.0000 mg | ORAL_TABLET | Freq: Four times a day (QID) | ORAL | 0 refills | Status: DC
  Filled 2023-10-03: qty 30, 8d supply, fill #0

## 2023-10-03 NOTE — Progress Notes (Addendum)
CSW spoke with CPS intake Annamarie Major regarding the CPS report that was made 10-01-2022.  CPS intake Annamarie Major reported the case was screened out.  CSW identifies no further need for intervention and no barriers to discharge at this time.   Enos Fling, Theresia Majors Clinical Social Worker [phone]

## 2023-10-03 NOTE — Lactation Note (Signed)
This note was copied from a baby's chart. Lactation Consultation Note  Patient Name: Joanna Buchanan Date: 10/03/2023 Age:26 hours  Reason for consult: Follow-up assessment;Early term 76-38.6wks  Mother and baby are discharged. Baby is being bottle fed with formula. Mother reports baby is breastfeeding and formula feeding well. She denied any questions or concerns at this time.   Mom made aware of O/P services, breastfeeding support groups, and our phone # for post-discharge questions.    Feeding Mother's Current Feeding Choice: Breast Milk and Formula Nipple Type: Slow - flow    Lactation Tools Discussed/Used Breast pump type: Manual      Discharge Discharge Education: Engorgement and breast care;Warning signs for feeding baby;Outpatient recommendation Pump: Manual (mother was not able to get a stork pump)  Consult Status Consult Status: Complete Date: 10/03/23    [name] 10/03/2023, 3:06 PM

## 2023-10-03 NOTE — Progress Notes (Signed)
Patient ID: Joanna Buchanan, female   DOB: Jan 18, 1996, 28 y.o.   MRN: [id number] POSTPARTUM PROGRESS NOTE  Post Partum Day 2  Subjective:  Joanna Buchanan is a 28 y.o. [id number] s/p RLTCS at [redacted]w[redacted]d.  No acute events overnight.  Pt denies problems with ambulating, voiding or po intake.  She denies nausea or vomiting.  Pain is well controlled.  She has had flatus. She has had bowel movement.  Lochia Minimal.   Objective: Blood pressure 114/72, pulse (!) 57, temperature 98.3 F (36.8 C), temperature source Oral, resp. rate 19, height 5\' 6"  (1.676 m), weight 99.3 kg, last menstrual period 07/09/2023, SpO2 100%.  Physical Exam:  General: alert, cooperative and no distress Chest: no respiratory distress Heart:regular rate, distal pulses intact Abdomen: soft, nontender,  Uterine Fundus: firm, appropriately tender DVT Evaluation: No calf swelling or tenderness Extremities: no edema Skin: warm, dry; incision clean/dry/intact  Recent Labs    07/24/23 0908 07/25/23 0856  HGB 13.3 14.0  HCT 40.4 42.2    Assessment/Plan: Joanna Buchanan is a 28 y.o. [id number] s/p RLTCS at [redacted]w[redacted]d  PPD#2 - Doing well Contraception: none Feeding: breast Dispo: Plan for discharge pending SW consult.   Buchanan: 2 days   Margie Billet, MD 10/03/2023 7:53 AM

## 2023-10-03 NOTE — Discharge Summary (Signed)
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 yo G3P3003 at [redacted]w[redacted]d was admitted to the hospital 10/01/2023 for scheduled cesarean section with the following indication:Elective Repeat.Delivery details are as follows:  Membrane Rupture Time/Date: 12:16 PM,10/01/2023  Delivery Method:C-Section, Low Transverse Operative Delivery:N/A Details of operation can be found in separate operative note.  Patient had a postpartum course that was uncomplicated. She did meet with SW due to housing insecurity. She is ambulating, tolerating a regular diet, passing flatus, and urinating well. Patient is discharged home in stable condition on  10/03/23        Newborn Data: Birth date:10/01/2023 Birth time:12:16 PM Gender:Female Living status:Living Apgars:8 ,9  Weight:3190 g    Magnesium Sulfate received: No BMZ received: No Rhophylac:N/A MMR:N/A T-DaP:Given postpartum Flu: N/A RSV Vaccine received: No Transfusion:No  Immunizations received: Immunization History  Administered Date(s) Administered   Moderna Sars-Covid-2 Vaccination 06/20/2020   Tdap 07/25/2023    Physical exam  Vitals:   10/02/23 0519  10/02/23 1528 10/02/23 1943 10/03/23 0506  BP: 110/68 107/63 130/61 137/83  Pulse: 64 77 77 83  Resp: 17 18 17 17   Temp: 98.2 F (36.8 C) 98 F (36.7 C)  98.2 F (36.8 C)  TempSrc: Oral Oral Oral Oral  SpO2: 100% 100%  100%  Weight:      Height:       General: alert, cooperative, and no distress Lochia: appropriate Uterine Fundus: firm Incision: Dressing is clean, dry, and intact DVT Evaluation: No evidence of DVT seen on physical exam. Negative Homan's sign. No cords or calf tenderness. Labs: Lab Results  Component Value Date   WBC 12.9 (H) 10/02/2023   HGB 9.7 (L) 10/02/2023   HCT 29.3 (L) 10/02/2023   MCV 89.9 10/02/2023   PLT 255 10/02/2023      Latest Ref Rng & Units 10/01/2023    3:46 PM  CMP  Creatinine 0.44 - 1.00 mg/dL 0.10    Edinburgh Score:    10/02/2023    5:19 AM  Edinburgh Postnatal Depression Scale Screening Tool  I have been able to laugh and see the funny side of things. 0  I have looked forward with enjoyment to things. 0  I have blamed myself unnecessarily when things went wrong. 1  I have been anxious or worried for no good reason. 2  I have felt scared or panicky for no good reason. 0  Things have been getting on top of me. 1  I have been so unhappy that I have had difficulty sleeping. 2  I have felt sad or miserable. 1  I have been so unhappy that I have been crying. 1  The thought of harming myself has occurred to me. 0  Edinburgh Postnatal Depression Scale Total 8   Edinburgh Postnatal Depression Scale Total: 8   After visit meds:  Allergies as of 10/03/2023       Reactions   Other Anaphylaxis, Shortness Of Breath, Swelling   Avocado, watermelon, cherries, bananas, oranges cause throat swelling   Shellfish Allergy Itching        Medication List     STOP taking these medications    Accu-Chek Guide w/Device Kit   Accu-Chek Softclix Lancets lancets   Aerochamber Plus Device   Dexcom G7 Receiver Devi   Dexcom G7 Sensor  Misc   glucose blood test strip   valACYclovir 500 MG tablet Commonly known as: Valtrex       TAKE these medications    ibuprofen 600 MG tablet Commonly known as: ADVIL Take 1 tablet (600 mg total) by mouth every 6 (six) hours.   oxyCODONE-acetaminophen 5-325 MG tablet Commonly known as: Percocet Take 1 tablet by mouth every 4 (four) hours as needed for severe pain (pain score 7-10).   prenatal multivitamin Tabs tablet Take 1 tablet by mouth daily at 12 noon.         Discharge home in stable condition Infant Feeding: Breast Infant Disposition:home with mother Discharge instruction: per After Visit Summary and Postpartum booklet. Activity: Advance as tolerated. Pelvic rest for 6 weeks.  Diet: routine diet Future Appointments: Future Appointments  Date Time Provider Department Center  10/04/2023 11:15 AM Venora Maples, MD [facility]   Follow up Visit:  Follow-up Information     Center for [facility]. Schedule an appointment as soon as possible for a visit in 2 week(s).   Specialty: Obstetrics and Gynecology Contact information: [address] [place] [address] [phone]                 Please schedule this patient for a In person postpartum visit in 4 weeks with the following provider: MD. Additional Postpartum F/U:2 hour GTT and Incision check 1 week  High risk pregnancy complicated by: GDM Delivery mode:  C-Section, Low Transverse Anticipated Birth Control:  Unsure   10/03/2023 Levie Heritage, DO

## 2023-10-04 ENCOUNTER — Encounter: Payer: 59 | Admitting: Family Medicine

## 2023-10-10 ENCOUNTER — Telehealth (HOSPITAL_COMMUNITY): Payer: Self-pay

## 2023-10-10 NOTE — Telephone Encounter (Signed)
10/10/2023 1117  Name: Joanna Buchanan MRN: [id number] DOB: October 21, 1995  Reason for Call:  Transition of Care Hospital Discharge Call  Contact Status: Patient Contact Status: Complete  Language assistant needed:          Follow-Up Questions: Do You Have Any Concerns About Your Health As You Heal From Delivery?: No Do You Have Any Concerns About Your Infants Health?: No  Edinburgh Postnatal Depression Scale:  In the Past 7 Days:    PHQ2-9 Depression Scale:     Discharge Follow-up: Edinburgh score requires follow up?:  (RN explained EPDS. When asking patient if she would like to do EPDS together she states that she is busy. RN provided call back phone number should she decide she would like to do EPDS.) Patient was advised of the following resources:: Support Group, Breastfeeding Support Group  WCC support group information e-mailed to patient.   Post-discharge interventions: Reviewed Newborn Safe Sleep Practices  Signature  Signe Colt

## 2023-10-13 ENCOUNTER — Ambulatory Visit: Payer: 59

## 2023-10-18 ENCOUNTER — Ambulatory Visit: Payer: 59

## 2023-11-25 ENCOUNTER — Ambulatory Visit: Payer: 59 | Admitting: Obstetrics and Gynecology

## 2023-11-28 ENCOUNTER — Ambulatory Visit: Admitting: Obstetrics and Gynecology

## 2023-11-28 NOTE — BH Specialist Note (Signed)
 Integrated Behavioral Health via Telemedicine Visit  12/07/2023 Joanna Buchanan [id number]  Number of Integrated Behavioral Health Clinician visits: 1- Initial Visit  Session Start time: 1006   Session End time: 1103  Total time in minutes: 57   Referring Provider: Federico Flake, MD Patient/Family location: [facility] Provider [facility]: [facility]  All persons participating in visit: Patient Joanna Buchanan and Joanna Buchanan   Types of Service: Individual psychotherapy and Video visit  I connected with Joanna Buchanan and/or Joanna Buchanan's  n/a  via  Telephone or Video Enabled Telemedicine Application  (Video is Caregility application) and verified that I am speaking with the correct person using two identifiers. Discussed confidentiality: Yes   I discussed the limitations of telemedicine and the availability of in person appointments.  Discussed there is a possibility of technology failure and discussed alternative modes of communication if that failure occurs.  I discussed that engaging in this telemedicine visit, they consent to the provision of behavioral healthcare and the services will be billed under their insurance.  Patient and/or legal guardian expressed understanding and consented to Telemedicine visit: Yes   Presenting Concerns: Patient and/or family reports the following symptoms/concerns: Concern about alcohol consumption that increased postpartum; attributes to using alcohol to cope with life stress (experienced homelessness in the past, attempts to help "toxic" family members, boyfriend's DUI, car breakdown, blood clot, teeth sensitivity). Pt is currently in stable housing, has blocked problematic family members, and is motivated to move forward in life; requesting medication to curb alcohol cravings.  Duration of problem: Increase postpartum; Severity of problem: severe  Patient and/or  Family's Strengths/Protective Factors: Social connections, Concrete supports in place (healthy food, safe environments, etc.), and Sense of purpose  Goals Addressed: Patient will:  Reduce symptoms of: stress   Increase knowledge and/or ability of: healthy habits   Demonstrate ability to: Increase healthy adjustment to current life circumstances and Decrease self-medicating behaviors  Progress towards Goals: Ongoing  Interventions: Interventions utilized:  Motivational Interviewing, Psychoeducation and/or Health Education, and Supportive Reflection Standardized Assessments completed: AUDIT  Patient and/or Family Response: Patient agrees with treatment plan.   Assessment: Patient currently experiencing Alcohol use disorder; Psychosocial stress.   Patient may benefit from psychoeducation and brief therapeutic interventions regarding coping with symptoms of alcohol use disorder and life stress  Plan: Follow up with behavioral health clinician on : Two weeks Behavioral recommendations:  -Accept referral to [facility] (see information on After Visit Summary); use walk-in 24/7 if needed -Consider phone reminder to go to sleep a little earlier than usual (example: 12:00 midnight instead of 1:00 am) to begin prioritizing healthy self-care -Continue setting healthy boundaries with unsupportive people in life; consider spending more time with supportive people in life Referral(s): Integrated Behavioral Health Services (In Clinic) and Substance Abuse Program  I discussed the assessment and treatment plan with the patient and/or parent/guardian. They were provided an opportunity to ask questions and all were answered. They agreed with the plan and demonstrated an understanding of the instructions.   They were advised to call back or seek an in-person evaluation if the symptoms worsen or if the condition fails to improve as anticipated.  Rae Lips, LCSW     04/26/2023    9:40 AM 03/29/2022     8:57 AM 02/04/2022    3:44 PM  Depression screen PHQ 2/9  Decreased Interest 1 2 2   Down, Depressed, Hopeless 1 1 1   PHQ - 2  Score 2 3 3   Altered sleeping 1 3 3   Tired, decreased energy 1 2 0  Change in appetite 1 0 2  Feeling bad or failure about yourself  0 1 2  Trouble concentrating 1 1 2   Moving slowly or fidgety/restless 0 0 3  Suicidal thoughts 0 0 0  PHQ-9 Score 6 10 15   Difficult doing work/chores Not difficult at all        04/26/2023    9:41 AM 03/29/2022    8:57 AM 02/04/2022    3:44 PM  GAD 7 : Generalized Anxiety Score  Nervous, Anxious, on Edge 1 1 2   Control/stop worrying 1 1 0  Worry too much - different things 1 1 1   Trouble relaxing 1 1 1   Restless 0 0 3  Easily annoyed or irritable 0 2 2  Afraid - awful might happen 0 0 0  Total GAD 7 Score 4 6 9   Anxiety Difficulty Not difficult at all         10/02/2023    5:19 AM 06/30/2022    9:56 AM 05/20/2022    1:00 PM 05/20/2022    9:10 AM 05/19/2022   10:50 PM  Edinburgh Postnatal Depression Scale Screening Tool  I have been able to laugh and see the funny side of things. 0 0 0 -- --  I have looked forward with enjoyment to things. 0 0 0    I have blamed myself unnecessarily when things went wrong. 1 1 2     I have been anxious or worried for no good reason. 2 0 2    I have felt scared or panicky for no good reason. 0 0 1    Things have been getting on top of me. 1 1 1     I have been so unhappy that I have had difficulty sleeping. 2 0 0    I have felt sad or miserable. 1 0 1    I have been so unhappy that I have been crying. 1 0 1    The thought of harming myself has occurred to me. 0 0 0    Edinburgh Postnatal Depression Scale Total 8 2 8

## 2023-12-07 ENCOUNTER — Ambulatory Visit: Admitting: Clinical

## 2023-12-07 DIAGNOSIS — F1090 Alcohol use, unspecified, uncomplicated: Secondary | ICD-10-CM

## 2023-12-07 DIAGNOSIS — Z658 Other specified problems related to psychosocial circumstances: Secondary | ICD-10-CM

## 2023-12-07 NOTE — Patient Instructions (Signed)

## 2023-12-08 ENCOUNTER — Ambulatory Visit: Admitting: Physician Assistant

## 2023-12-09 ENCOUNTER — Ambulatory Visit (INDEPENDENT_AMBULATORY_CARE_PROVIDER_SITE_OTHER): Admitting: Student

## 2023-12-09 ENCOUNTER — Encounter (HOSPITAL_COMMUNITY): Payer: Self-pay | Admitting: Student

## 2023-12-09 VITALS — BP 121/100 | HR 100 | Ht 68.0 in | Wt 329.0 lb

## 2023-12-09 DIAGNOSIS — F331 Major depressive disorder, recurrent, moderate: Secondary | ICD-10-CM

## 2023-12-09 DIAGNOSIS — F1020 Alcohol dependence, uncomplicated: Secondary | ICD-10-CM

## 2023-12-09 DIAGNOSIS — F411 Generalized anxiety disorder: Secondary | ICD-10-CM | POA: Diagnosis not present

## 2023-12-09 MED ORDER — CITALOPRAM HYDROBROMIDE 10 MG PO TABS: 10.0000 mg | ORAL_TABLET | Freq: Every day | ORAL | 1 refills | Status: DC

## 2023-12-09 MED ORDER — NALTREXONE HCL 50 MG PO TABS: 25.0000 mg | ORAL_TABLET | Freq: Every day | ORAL | 1 refills | Status: AC

## 2023-12-09 NOTE — BH Specialist Note (Unsigned)
  Pt did not arrive to video visit and did not answer the phone; Unable to leave message; left MyChart message for patient.

## 2023-12-09 NOTE — Patient Instructions (Signed)
 SUBSTANCE USE TREATMENT for Medicaid and State Funded/IPRS  [facility] [address] [place], [address] [phone] phone NOTE: [facility] is no longer offering IOP services.  Serves those who are low-income or have no insurance.  [facility] [address] [place], [place], [address] [phone] phone [phone] fax NOTE: Does have Substance Abuse-Intensive Outpatient Program [facility]) as well as transitional housing if eligible.  [facility] [address] [place], [place], [address] [phone] phone [phone] fax  [facility] [phone] [address] [place], [place], [address] [phone] phone [phone] fax  HALFWAY HOUSES:  [facility] Admissions Coordinator [phone]  [facility] www.oxfordvacancies.com  12 STEP PROGRAMS:  Alcoholics Anonymous of [place] [employer]  Narcotics Anonymous of [place] [employer]  Al-Anon of [place], [place] www.[place]alanon.org/find-meetings.html  Nar-Anon https://nar-anon.org/find-a-meetin  List of Residential placements:   [facility] in [place]: [phone]  [facility]: [phone]  [place], [place] [phone]: Female and female facility; 30-day program: (uninsured and Medicaid such as Laurena Bering, [place], [place], partners)  [facility]: [phone]; men and women's facility; 28 days; Can have Medicaid tailored plan Tour manager or Partners)  [facility]: [phone] Karoline Caldwell or Larita Fife; 28 day program; must be fully detox; tailored Medicaid or no insurance  [address] in [place], [place]; [phone]; 28 day all males program; no insurance accepted  [facility] Referral in [place]: Gabriel Rung [phone] (no insurance or Medicaid only); 90 days; outpatient services but provide housing in apartments downtown [place]  RTS  Admission: [phone]: Patient must complete phone screening for placement: [place], [place]; 6 month program; uninsured, Medicaid, and [employer].   [facility]: no insurance required; [phone]  [facility]: [phone]; Intake: Molly Maduro; Must fill out application online; Alecia Lemming Delay [phone] x [phone] [facility] in [place], [place]: [phone]; Admissions Coordinators Mr. Maurine Minister or Barron Alvine; 90 day program.  [facility]: [place], [place] [phone]; Co-Ed 9 month to a year program; Online application; Men entry fee is $500 (6-19months);  [employer]: [address] [place], [place] [address]; no fee or insurance required; minimum of 2 years; Highly structured; work based; Intake Coordinator is Thayer Ohm [phone]  [facility] in [place], [place]: [phone]; Fax number is [phone]; website: www.[facility].org; Requires 3-6 page autobiography; 2 year program (18 months and then 37month transitional housing); Admission fee is $300; no insurance needed; work Automotive engineer in [place], [place]: [employer] Desk Staff: Danise Edge [phone]: They have a Men's Regenerations Program 6-537months. Free program; There is an initial $300 fee however, they are willing to work with patients regarding that. Application is online.  [facility]: Admissions [phone] Doran Heater ext [phone]; Any 7-90 day program is out of pocket; 12 month program is free of charge; there is a $275 entry fee; Patient is responsible for own transportation  [facility]: [phone]: Ask about specific location  [facility]: Female and Female facility; [phone]  [facility] in [place], [place] [phone] or [phone]  [facility]: Main: [phone]
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
## 2023-12-20 DIAGNOSIS — F1020 Alcohol dependence, uncomplicated: Secondary | ICD-10-CM | POA: Insufficient documentation

## 2023-12-20 DIAGNOSIS — F411 Generalized anxiety disorder: Secondary | ICD-10-CM | POA: Insufficient documentation

## 2023-12-20 DIAGNOSIS — F331 Major depressive disorder, recurrent, moderate: Secondary | ICD-10-CM | POA: Insufficient documentation

## 2023-12-21 ENCOUNTER — Ambulatory Visit: Admitting: Clinical

## 2024-01-01 ENCOUNTER — Encounter (HOSPITAL_COMMUNITY): Payer: Self-pay

## 2024-01-01 ENCOUNTER — Emergency Department (HOSPITAL_COMMUNITY)
Admission: EM | Admit: 2024-01-01 | Discharge: 2024-01-01 | Attending: Emergency Medicine | Admitting: Emergency Medicine

## 2024-01-01 ENCOUNTER — Other Ambulatory Visit: Payer: Self-pay

## 2024-01-01 ENCOUNTER — Emergency Department (HOSPITAL_COMMUNITY)

## 2024-01-01 DIAGNOSIS — H1132 Conjunctival hemorrhage, left eye: Secondary | ICD-10-CM | POA: Insufficient documentation

## 2024-01-01 DIAGNOSIS — Z23 Encounter for immunization: Secondary | ICD-10-CM | POA: Insufficient documentation

## 2024-01-01 DIAGNOSIS — S01511A Laceration without foreign body of lip, initial encounter: Secondary | ICD-10-CM | POA: Diagnosis not present

## 2024-01-01 DIAGNOSIS — S0083XA Contusion of other part of head, initial encounter: Secondary | ICD-10-CM

## 2024-01-01 DIAGNOSIS — R519 Headache, unspecified: Secondary | ICD-10-CM | POA: Diagnosis present

## 2024-01-01 MED ORDER — TETANUS-DIPHTH-ACELL PERTUSSIS 5-2.5-18.5 LF-MCG/0.5 IM SUSY
0.5000 mL | PREFILLED_SYRINGE | Freq: Once | INTRAMUSCULAR | Status: AC
  Administered 2024-01-01: 0.5 mL via INTRAMUSCULAR
  Filled 2024-01-01: qty 0.5

## 2024-01-01 MED ORDER — LIDOCAINE HCL (PF) 1 % IJ SOLN
5.0000 mL | Freq: Once | INTRAMUSCULAR | Status: DC
  Filled 2024-01-01: qty 30

## 2024-01-01 MED ORDER — ACETAMINOPHEN 500 MG PO TABS
1000.0000 mg | ORAL_TABLET | Freq: Once | ORAL | Status: AC
  Administered 2024-01-01: 1000 mg via ORAL
  Filled 2024-01-01: qty 2

## 2024-01-01 MED ORDER — AMOXICILLIN-POT CLAVULANATE 875-125 MG PO TABS: 1.0000 | ORAL_TABLET | Freq: Two times a day (BID) | ORAL | 0 refills | Status: DC

## 2024-01-01 MED ORDER — FLUORESCEIN SODIUM 1 MG OP STRP
1.0000 | ORAL_STRIP | Freq: Once | OPHTHALMIC | Status: AC
  Administered 2024-01-01: 1 via OPHTHALMIC
  Filled 2024-01-01: qty 1

## 2024-01-01 MED ORDER — TETRACAINE HCL 0.5 % OP SOLN
1.0000 [drp] | Freq: Once | OPHTHALMIC | Status: AC
  Administered 2024-01-01: 1 [drp] via OPHTHALMIC
  Filled 2024-01-01: qty 4

## 2024-01-01 MED ORDER — LIDOCAINE-EPINEPHRINE 1 %-1:100000 IJ SOLN
10.0000 mL | Freq: Once | INTRAMUSCULAR | Status: AC
  Administered 2024-01-01: 10 mL
  Filled 2024-01-01: qty 10
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
## 2024-01-03 ENCOUNTER — Encounter (HOSPITAL_COMMUNITY): Payer: Self-pay

## 2024-01-05 ENCOUNTER — Ambulatory Visit (HOSPITAL_COMMUNITY)

## 2024-01-19 ENCOUNTER — Encounter (HOSPITAL_COMMUNITY): Admitting: Student

## 2024-01-31 ENCOUNTER — Ambulatory Visit (HOSPITAL_COMMUNITY): Admitting: Licensed Clinical Social Worker

## 2024-02-02 ENCOUNTER — Encounter (HOSPITAL_COMMUNITY): Payer: Self-pay

## 2024-02-02 ENCOUNTER — Ambulatory Visit (HOSPITAL_COMMUNITY)
Admission: EM | Admit: 2024-02-02 | Discharge: 2024-02-02 | Disposition: A | Discharge status: Home / Self Care | Attending: Family Medicine | Admitting: Family Medicine

## 2024-02-02 DIAGNOSIS — H6692 Otitis media, unspecified, left ear: Secondary | ICD-10-CM

## 2024-02-02 DIAGNOSIS — J4521 Mild intermittent asthma with (acute) exacerbation: Secondary | ICD-10-CM

## 2024-02-02 MED ORDER — PREDNISONE 20 MG PO TABS: 40.0000 mg | ORAL_TABLET | Freq: Every day | ORAL | 0 refills | Status: AC

## 2024-02-02 MED ORDER — ALBUTEROL SULFATE HFA 108 (90 BASE) MCG/ACT IN AERS: 2.0000 | INHALATION_SPRAY | RESPIRATORY_TRACT | 0 refills | Status: AC | PRN

## 2024-02-02 MED ORDER — DOXYCYCLINE HYCLATE 100 MG PO CAPS: 100.0000 mg | ORAL_CAPSULE | Freq: Two times a day (BID) | ORAL | 0 refills | Status: AC
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
## 2024-02-02 NOTE — Discharge Instructions (Signed)
Albuterol inhaler--do 2 puffs every 4 hours as needed for shortness of breath or wheezing  Take prednisone 20 mg--2 daily for 5 days  Take doxycycline 100 mg --1 capsule 2 times daily for 7 days  

## 2024-02-02 NOTE — ED Triage Notes (Signed)
 Pt c/o sore throat and lt ear pain x3 wks. States started with a toothache but that's better now. Took tylenol  with no relief.

## 2024-03-27 DIAGNOSIS — H579 Unspecified disorder of eye and adnexa: Secondary | ICD-10-CM | POA: Insufficient documentation

## 2024-03-28 ENCOUNTER — Encounter (HOSPITAL_COMMUNITY): Payer: Self-pay | Admitting: *Deleted

## 2024-03-28 ENCOUNTER — Ambulatory Visit (HOSPITAL_COMMUNITY)
Admission: EM | Admit: 2024-03-28 | Discharge: 2024-03-28 | Disposition: A | Discharge status: Home / Self Care | Attending: Emergency Medicine | Admitting: Emergency Medicine

## 2024-03-28 DIAGNOSIS — J3489 Other specified disorders of nose and nasal sinuses: Secondary | ICD-10-CM | POA: Diagnosis not present

## 2024-03-28 DIAGNOSIS — S0502XA Injury of conjunctiva and corneal abrasion without foreign body, left eye, initial encounter: Secondary | ICD-10-CM

## 2024-03-28 MED ORDER — ERYTHROMYCIN 5 MG/GM OP OINT: 1.0000 | TOPICAL_OINTMENT | Freq: Four times a day (QID) | OPHTHALMIC | 0 refills | Status: AC

## 2024-03-28 MED ORDER — EYE WASH OP SOLN
OPHTHALMIC | Status: AC
  Filled 2024-03-28: qty 118

## 2024-03-28 MED ORDER — TETRACAINE HCL 0.5 % OP SOLN
OPHTHALMIC | Status: AC
  Filled 2024-03-28: qty 4
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
## 2024-03-28 NOTE — Discharge Instructions (Signed)
 Use erythromycin  ointment 4 times daily for 5 days for coverage of corneal abrasion. You can alternate between 600 mg of Tylenol  and 400 mg of ibuprofen  every 6-8 hours as needed for pain. Follow-up with your primary care provider or return here as needed.

## 2024-05-15 DIAGNOSIS — Z4000 Encounter for prophylactic removal of unspecified organ: Secondary | ICD-10-CM | POA: Insufficient documentation

## 2024-05-31 ENCOUNTER — Ambulatory Visit (HOSPITAL_COMMUNITY)
Admission: EM | Admit: 2024-05-31 | Discharge: 2024-05-31 | Disposition: A | Discharge status: Home / Self Care | Attending: Family Medicine | Admitting: Family Medicine

## 2024-05-31 ENCOUNTER — Encounter (HOSPITAL_COMMUNITY): Payer: Self-pay

## 2024-05-31 DIAGNOSIS — R7301 Impaired fasting glucose: Secondary | ICD-10-CM | POA: Diagnosis present

## 2024-05-31 DIAGNOSIS — R0981 Nasal congestion: Secondary | ICD-10-CM | POA: Diagnosis present

## 2024-05-31 DIAGNOSIS — R051 Acute cough: Secondary | ICD-10-CM | POA: Diagnosis present

## 2024-05-31 DIAGNOSIS — Z3201 Encounter for pregnancy test, result positive: Secondary | ICD-10-CM | POA: Diagnosis not present

## 2024-05-31 DIAGNOSIS — R202 Paresthesia of skin: Secondary | ICD-10-CM | POA: Insufficient documentation

## 2024-05-31 DIAGNOSIS — R200 Anesthesia of skin: Secondary | ICD-10-CM | POA: Diagnosis present

## 2024-05-31 DIAGNOSIS — Z0189 Encounter for other specified special examinations: Secondary | ICD-10-CM | POA: Diagnosis present

## 2024-05-31 DIAGNOSIS — H538 Other visual disturbances: Secondary | ICD-10-CM | POA: Insufficient documentation

## 2024-05-31 HISTORY — DX: Unspecified asthma, uncomplicated: J45.909

## 2024-05-31 LAB — COMPREHENSIVE METABOLIC PANEL WITH GFR
ALT: 19 U/L (ref 0–44)
AST: 22 U/L (ref 15–41)
Albumin: 3.1 g/dL — ABNORMAL LOW (ref 3.5–5.0)
Alkaline Phosphatase: 41 U/L (ref 38–126)
Anion gap: 9 (ref 5–15)
BUN: 9 mg/dL (ref 6–20)
CO2: 21 mmol/L — ABNORMAL LOW (ref 22–32)
Calcium: 8.5 mg/dL — ABNORMAL LOW (ref 8.9–10.3)
Chloride: 106 mmol/L (ref 98–111)
Creatinine, Ser: 0.7 mg/dL (ref 0.44–1.00)
GFR, Estimated: >60 mL/min (ref >60)
Glucose, Bld: 106 mg/dL — ABNORMAL HIGH (ref 70–99)
Potassium: 3.5 mmol/L (ref 3.5–5.1)
Sodium: 136 mmol/L (ref 135–145)
Total Bilirubin: 0.4 mg/dL (ref 0.0–1.2)
Total Protein: 6 g/dL — ABNORMAL LOW (ref 6.5–8.1)

## 2024-05-31 LAB — CBC
HCT: 34.1 % — ABNORMAL LOW (ref 36.0–46.0)
Hemoglobin: 11.2 g/dL — ABNORMAL LOW (ref 12.0–15.0)
MCH: 29.7 pg (ref 26.0–34.0)
MCHC: 32.8 g/dL (ref 30.0–36.0)
MCV: 90.5 fL (ref 80.0–100.0)
Platelets: 284 K/uL (ref 150–400)
RBC: 3.77 MIL/uL — ABNORMAL LOW (ref 3.87–5.11)
RDW: 13.2 % (ref 11.5–15.5)
WBC: 3 K/uL — ABNORMAL LOW (ref 4.0–10.5)
nRBC: 0 % (ref 0.0–0.2)

## 2024-05-31 LAB — HEMOGLOBIN A1C
Hgb A1c MFr Bld: 5.1 % (ref 4.8–5.6)
Mean Plasma Glucose: 99.67 mg/dL

## 2024-05-31 LAB — POCT FASTING CBG KUC MANUAL ENTRY: POCT Glucose (KUC): 129 mg/dL — AB (ref 70–99)

## 2024-05-31 LAB — POCT URINE PREGNANCY: Preg Test, Ur: POSITIVE — AB

## 2024-05-31 MED ORDER — AMOXICILLIN 875 MG PO TABS: 875.0000 mg | ORAL_TABLET | Freq: Two times a day (BID) | ORAL | 0 refills | Status: AC

## 2024-05-31 MED ORDER — PRENATAL MULTIVITAMIN CH: 1.0000 | ORAL_TABLET | Freq: Every day | ORAL | 1 refills | Status: DC

## 2024-05-31 MED ORDER — PRENATAL MULTIVITAMIN CH: 1.0000 | ORAL_TABLET | Freq: Every day | ORAL | 1 refills | Status: AC

## 2024-05-31 NOTE — ED Triage Notes (Addendum)
 Patient reports that she had a positive home pregnancy test and is wanting a pregnancy test today.  Patient also reports a cough that began last week and states when she coughs she has pain in her abdominal muscles. Patient states she has heartburn so bad at night that she can not sleep.  Patient also reports that she has been having bilateral arm numbness and the right is constant since June/July. Patient also reports that she has had intermittent blurred vision. Patient states Diabetes runs in her family  Patient denies taking any medication for her symptoms.

## 2024-05-31 NOTE — Discharge Instructions (Addendum)
 Call your OB provider to make a follow up appointment.  You have had labs (blood tests) sent today. We will call you with any significant abnormalities or if there is need to begin or change treatment or pursue further follow up.  You may also review your test results online through MyChart. If you do not have a MyChart account, instructions to sign up should be on your discharge paperwork.

## 2024-05-31 NOTE — ED Provider Notes (Addendum)
 Joanna Buchanan, Joanna Buchanan   [id number] 05/31/24 Arrival Time: 1100  ASSESSMENT & PLAN:  1. Positive pregnancy test   2. Patient request for diagnostic testing   3. Blurry vision   4. Elevated fasting blood sugar   5. Numbness and tingling in right hand   6. Acute cough   7. Nasal congestion    Without resp issues; with sinus congestion/pressure also. Copy of + pregnancy test given.  Discussed elevated fasting blood sugar. Pending labs: Orders Placed This Encounter  Procedures   CBC   Comprehensive metabolic panel with GFR   Hemoglobin A1c    She is to call her OB for pregnancy f/u.  Results for orders placed or performed during the hospital encounter of 05/31/24  POCT urine pregnancy   Collection Time: 05/31/24 11:29 AM  Result Value Ref Range   Preg Test, Ur Positive (A) Negative  POC CBG monitoring   Collection Time: 05/31/24 12:03 PM  Result Value Ref Range   POCT Glucose ([facility]) 129 (A) 70 - 99 mg/dL   Given nasal cong/continued cough x 2 weeks, will tx with amox. Prenatal vitamin Rx given.  Meds ordered this encounter  Medications   amoxicillin  (AMOXIL ) 875 MG tablet    Sig: Take 1 tablet (875 mg total) by mouth 2 (two) times daily for 7 days.    Dispense:  14 tablet    Refill:  0   Prenatal Vit-Fe Fumarate-FA (PRENATAL MULTIVITAMIN) TABS tablet    Sig: Take 1 tablet by mouth daily at 12 noon.    Dispense:  100 tablet    Refill:  1   For fairly long-standing numbness of R hand, rec she schedule appt with [facility].   Follow-up Information     Schedule an appointment as soon as possible for a visit  with [facility]   Why: For follow up. Contact information: [address] [place] [address] [phone]         Schedule an appointment as soon as possible for a visit  with [facility]   Why: To evaluate your right hand numbness and weakness. Contact information: [address] [place]  [place]  [address] [phone]                 Reviewed expectations re: course of current medical issues. Questions answered. Outlined signs and symptoms indicating need for more acute intervention. Understanding verbalized. After Visit Summary given.   SUBJECTIVE: History from: Patient. Joanna Buchanan is a 28 y.o. female. Patient reports that she had a positive home pregnancy test and is wanting a pregnancy test today. Denies n/v. Tolerating PO.  Patient also reports a cough that began last week and states when she coughs she has pain in her abdominal muscles. Patient states she has heartburn so bad at night that she can not sleep. Nasal congestion persistent for a couple of weeks.  Patient also reports that she has been having bilateral arm numbness and the right is constant since June/July. Patient also reports that she has had intermittent blurred vision. Patient states Diabetes runs in her family  Patient denies taking any medication for her symptoms.    OBJECTIVE:  Vitals:   05/31/24 1116 05/31/24 1117  BP: 134/86   Pulse: 89   Resp: 16   Temp: 98.1 F (36.7 C)   TempSrc: Oral   SpO2: 98% 98%    General appearance: alert; no distress Eyes: PERRLA; EOMI; conjunctiva normal HENT: [place]; AT; with nasal congestion Neck: supple  Lungs: speaks full sentences without difficulty; unlabored; clear bilat; dry cough Abd: obese; benign Extremities: no edema Skin: warm and dry Neurologic: normal gait; does have somewhat decreased grip strength of R hand and reports slightly decreased soft touch sensation Psychological: alert and cooperative; normal mood and affect  Labs: Results for orders placed or performed during the hospital encounter of 05/31/24  POCT urine pregnancy   Collection Time: 05/31/24 11:29 AM  Result Value Ref Range   Preg Test, Ur Positive (A) Negative  POC CBG monitoring   Collection Time: 05/31/24 12:03 PM  Result Value Ref Range   POCT Glucose ([facility])  129 (A) 70 - 99 mg/dL   Labs Reviewed  POCT URINE PREGNANCY - Abnormal; Notable for the following components:      Result Value   Preg Test, Ur Positive (*)    All other components within normal limits  POCT FASTING CBG [facility] MANUAL ENTRY - Abnormal; Notable for the following components:   POCT Glucose ([facility]) 129 (*)    All other components within normal limits  CBC  COMPREHENSIVE METABOLIC PANEL WITH GFR  HEMOGLOBIN A1C    Imaging: No results found.  Allergies  Allergen Reactions   Other Anaphylaxis, Shortness Of Breath and Swelling    Avocado, watermelon, cherries, bananas, oranges cause throat swelling   Shellfish Allergy Itching    Past Medical History:  Diagnosis Date   Asthma    GERD (gastroesophageal reflux disease)    Gestational diabetes    Gestational diabetes mellitus (GDM) affecting pregnancy, antepartum 03/30/2022   Current Diabetic Medications:  None  [ ]  Aspirin  81 mg daily after 12 weeks (? A2/B GDM)  Required Referrals for A1GDM or A2GDM: [ ]  Diabetes Education and Testing Supplies [ ]  Nutrition Cousult  For A2/B GDM or higher classes of DM [ ]  Diabetes Education and Testing Supplies [ ]  Nutrition Counsult [ ]  Fetal ECHO after 20 weeks  [ ]  Eye exam for retina evaluation   Baseline and surveillance labs (   Herpes, genital    Medical history non-contributory    Social History   Socioeconomic History   Marital status: Single    Spouse name: Not on file   Number of children: Not on file   Years of education: Not on file   Highest education level: Not on file  Occupational History   Not on file  Tobacco Use   Smoking status: Every Day    Types: Cigars    Passive exposure: Current   Smokeless tobacco: Never  Vaping Use   Vaping status: Never Used  Substance and Sexual Activity   Alcohol use: Not Currently   Drug use: Yes    Types: Marijuana   Sexual activity: Yes    Birth control/protection: None  Other Topics Concern   Not on file  Social History  Narrative   Not on file   Social Drivers of Health   Financial Resource Strain: Not on File (12/30/2021)   Received from [employer]    Financial Resource Strain: 0  Food Insecurity: Food Insecurity Present (10/01/2023)   Hunger Vital Sign    Worried About Running Out of Food in the Last Year: Sometimes true    Ran Out of Food in the Last Year: Sometimes true  Transportation Needs: No Transportation Needs (10/01/2023)   PRAPARE - Administrator, Civil Service (Medical): No    Lack of Transportation (Non-Medical): No  Physical Activity: Not on File (  12/30/2021)   Received from [facility]   Physical Activity    Physical Activity: 0  Stress: Not on File (12/30/2021)   Received from [facility]   Stress    Stress: 0  Social Connections: Unknown (10/01/2023)   Social Connection and Isolation Panel    Frequency of Communication with Friends and Family: Patient declined    Frequency of Social Gatherings with Friends and Family: Patient declined    Attends Religious Services: Not on Insurance claims handler of Clubs or Organizations: Patient declined    Attends Banker Meetings: Not on file    Marital Status: Patient declined  Intimate Partner Violence: Not At Risk (10/01/2023)   Humiliation, Afraid, Rape, and Kick questionnaire    Fear of Current or Ex-Partner: No    Emotionally Abused: No    Physically Abused: No    Sexually Abused: No   Family History  Problem Relation Age of Onset   Diabetes Mother    Hypertension Mother    Asthma Mother    Multiple sclerosis Mother    Cancer Maternal Aunt    Diabetes Maternal Grandmother    Birth defects Neg Hx    Heart disease Neg Hx    Stroke Neg Hx    Past Surgical History:  Procedure Laterality Date   CESAREAN SECTION     CESAREAN SECTION N/A 05/19/2022   Procedure: CESAREAN SECTION;  Surgeon: Eldonna Suzen Octave, MD;  Location: [facility];  Service: Obstetrics;  Laterality: N/A;   CESAREAN SECTION  N/A 10/01/2023   Procedure: CESAREAN SECTION;  Surgeon: Nicholaus Burnard HERO, MD;  Location: [facility];  Service: Obstetrics;  Laterality: N/A;   MORRIS Rolinda Rogue, MD 05/31/24 1313    Rolinda Rogue, MD 05/31/24 1314

## 2024-06-01 ENCOUNTER — Ambulatory Visit (HOSPITAL_COMMUNITY): Payer: Self-pay

## 2024-06-01 NOTE — Telephone Encounter (Signed)
 Patient's metabolic panel reflects poor dietary intake of protein and other essential nutrients.  Because patient is pregnant, recommend patient increase intake of lean proteins, variety of different colored vegetables and whole grains.  CBC is concerning for low hemoglobin and low white blood cell count.  Recommend follow-up with OB/GYN as soon as possible.

## 2024-06-19 ENCOUNTER — Encounter (HOSPITAL_COMMUNITY): Payer: Self-pay

## 2024-06-19 ENCOUNTER — Ambulatory Visit (HOSPITAL_COMMUNITY): Admission: EM | Admit: 2024-06-19 | Discharge: 2024-06-19 | Disposition: A | Discharge status: Home / Self Care

## 2024-06-19 DIAGNOSIS — G44209 Tension-type headache, unspecified, not intractable: Secondary | ICD-10-CM

## 2024-06-19 DIAGNOSIS — G44201 Tension-type headache, unspecified, intractable: Secondary | ICD-10-CM

## 2024-06-19 LAB — POCT FASTING CBG KUC MANUAL ENTRY: POCT Glucose (KUC): 143 mg/dL — AB (ref 70–99)

## 2024-06-19 MED ORDER — ACETAMINOPHEN 325 MG PO TABS
ORAL_TABLET | ORAL | Status: AC
  Filled 2024-06-19: qty 1

## 2024-06-19 MED ORDER — ACETAMINOPHEN 325 MG PO TABS
ORAL_TABLET | ORAL | Status: AC
  Filled 2024-06-19: qty 3

## 2024-06-19 MED ORDER — ACETAMINOPHEN 325 MG PO TABS
975.0000 mg | ORAL_TABLET | Freq: Once | ORAL | Status: AC
  Administered 2024-06-19: 975 mg via ORAL

## 2024-06-19 NOTE — ED Triage Notes (Signed)
 Presenting with headaches, an emptiness after eating, light headed/faint onset 2 weeks ago. States the headaches have gotten worse to where he can not focus and messes with her reading.
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
## 2024-06-19 NOTE — ED Notes (Signed)
 Patient is being discharged from the Urgent Care and sent to the Emergency Department via private vehicle . Per provider, patient is in need of higher level of care due to headache 10/10 pain score. Patient is aware and verbalizes understanding of plan of care.  Vitals:   06/19/24 1247  BP: 107/75  Pulse: 86  Resp: 18  Temp: 98.2 F (36.8 C)  SpO2: 97%

## 2024-06-28 ENCOUNTER — Other Ambulatory Visit: Payer: Self-pay

## 2024-06-28 ENCOUNTER — Telehealth (INDEPENDENT_AMBULATORY_CARE_PROVIDER_SITE_OTHER)

## 2024-06-28 DIAGNOSIS — O0991 Supervision of high risk pregnancy, unspecified, first trimester: Secondary | ICD-10-CM

## 2024-06-28 DIAGNOSIS — Z3A10 10 weeks gestation of pregnancy: Secondary | ICD-10-CM

## 2024-06-28 DIAGNOSIS — O0990 Supervision of high risk pregnancy, unspecified, unspecified trimester: Secondary | ICD-10-CM | POA: Insufficient documentation

## 2024-06-28 MED ORDER — BLOOD PRESSURE KIT DEVI: 1.0000 | Freq: Once | 0 refills | Status: AC

## 2024-06-28 NOTE — Patient Instructions (Signed)

## 2024-06-28 NOTE — Progress Notes (Signed)
 New OB Intake  I connected with Hollie Barbary  on 06/28/24 at  1:15 PM EDT by MyChart Video Visit and verified that I am speaking with the correct person using two identifiers. Nurse is located at [facility] and pt is located at home.  I discussed the limitations, risks, security and privacy concerns of performing an evaluation and management service by telephone and the availability of in person appointments. I also discussed with the patient that there may be a patient responsible charge related to this service. The patient expressed understanding and agreed to proceed.  I explained I am completing New OB Intake today. We discussed EDD of 01/23/25 based on LMP of 04/18/24.  Patient reports she is unsure of her last period she is guessing the approximate date in August, she does not remember having a period in September.  Dating ultrasound scheduled for 07/03/24 at 3:55pm.  Pt is [id number]. I reviewed her allergies, medications and Medical/Surgical/OB history.    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 Discussed the nature of our practice with multiple providers including residents and students as well as female and female providers. Due to the size of the practice, the delivering provider may not be the same as those providing prenatal care.   Patient is not interested in water  birth.  MyChart/Babyscripts MyChart access verified. I explained pt will have some visits in office and some virtually. Babyscripts  instructions given and order placed. Patient verifies receipt of registration text/e-mail. Account successfully created and app downloaded. If patient is a candidate for Optimized scheduling, add to sticky note.   Blood Pressure Cuff/Weight Scale Blood pressure cuff ordered for patient to pick-up from [employer]. Explained after first prenatal appt pt will check weekly and document in Babyscripts. Patient does have weight scale.  Anatomy US  Explained first scheduled US  will be around 19 weeks. Anatomy US  will be scheduled after dating ultrasound.  Is patient a CenteringPregnancy candidate?  Declined Declined due to Declined to say    Is patient a Mom+Baby Combined Care candidate?  Not a candidate     Is patient a candidate for Babyscripts Optimization? No, due to risk factors.   First visit review I reviewed new OB appt with patient. Explained pt will be seen by Dr. Eveline at first visit 07/19/24 at 02:55pm. Discussed Natera genetic screening with patient. Pt desires Panorama . Routine prenatal labs and genetic testing needed at [facility] OB visit.   Last Pap Diagnosis  Date Value Ref Range Status  04/26/2023   Final   - Negative for intraepithelial lesion or malignancy (NILM)    Waddell LITTIE Burows, RN 06/28/2024  2:00 PM

## 2024-07-03 ENCOUNTER — Other Ambulatory Visit

## 2024-07-10 ENCOUNTER — Other Ambulatory Visit

## 2024-07-17 ENCOUNTER — Other Ambulatory Visit: Payer: Self-pay | Admitting: Obstetrics & Gynecology

## 2024-07-17 ENCOUNTER — Ambulatory Visit

## 2024-07-17 ENCOUNTER — Other Ambulatory Visit: Payer: Self-pay

## 2024-07-17 DIAGNOSIS — O0990 Supervision of high risk pregnancy, unspecified, unspecified trimester: Secondary | ICD-10-CM

## 2024-07-17 DIAGNOSIS — O0991 Supervision of high risk pregnancy, unspecified, first trimester: Secondary | ICD-10-CM

## 2024-07-17 DIAGNOSIS — Z3A12 12 weeks gestation of pregnancy: Secondary | ICD-10-CM

## 2024-07-19 ENCOUNTER — Encounter: Admitting: Obstetrics & Gynecology

## 2024-08-12 ENCOUNTER — Other Ambulatory Visit: Payer: Self-pay

## 2024-08-12 ENCOUNTER — Encounter (HOSPITAL_COMMUNITY): Payer: Self-pay | Admitting: Obstetrics & Gynecology

## 2024-08-12 ENCOUNTER — Inpatient Hospital Stay (HOSPITAL_COMMUNITY)
Admission: AD | Admit: 2024-08-12 | Discharge: 2024-08-12 | Attending: Obstetrics & Gynecology | Admitting: Obstetrics & Gynecology

## 2024-08-12 DIAGNOSIS — R030 Elevated blood-pressure reading, without diagnosis of hypertension: Secondary | ICD-10-CM

## 2024-08-12 DIAGNOSIS — Z3A16 16 weeks gestation of pregnancy: Secondary | ICD-10-CM

## 2024-08-12 DIAGNOSIS — R109 Unspecified abdominal pain: Secondary | ICD-10-CM | POA: Diagnosis not present

## 2024-08-12 DIAGNOSIS — O26892 Other specified pregnancy related conditions, second trimester: Secondary | ICD-10-CM

## 2024-08-12 LAB — URINALYSIS, ROUTINE W REFLEX MICROSCOPIC
Bilirubin Urine: NEGATIVE
Glucose, UA: NEGATIVE mg/dL
Hgb urine dipstick: NEGATIVE
Ketones, ur: NEGATIVE mg/dL
Nitrite: NEGATIVE
Protein, ur: NEGATIVE mg/dL
Specific Gravity, Urine: 1.014 (ref 1.005–1.030)
pH: 6 (ref 5.0–8.0)

## 2024-08-12 LAB — HEPATITIS C ANTIBODY: HCV Ab: NONREACTIVE

## 2024-08-12 LAB — WET PREP, GENITAL
Clue Cells Wet Prep HPF POC: NONE SEEN
Sperm: NONE SEEN
Trich, Wet Prep: NONE SEEN
WBC, Wet Prep HPF POC: <10 (ref <10)
Yeast Wet Prep HPF POC: NONE SEEN

## 2024-08-12 LAB — HIV ANTIBODY (ROUTINE TESTING W REFLEX): HIV Screen 4th Generation wRfx: NONREACTIVE

## 2024-08-12 LAB — PROTEIN / CREATININE RATIO, URINE
Creatinine, Urine: 107 mg/dL
Protein Creatinine Ratio: 0.13 mg/mg{creat} (ref 0.00–0.15)
Total Protein, Urine: 14 mg/dL

## 2024-08-12 LAB — SYPHILIS: RPR W/REFLEX TO RPR TITER AND TREPONEMAL ANTIBODIES, TRADITIONAL SCREENING AND DIAGNOSIS ALGORITHM: RPR Ser Ql: NONREACTIVE

## 2024-08-12 LAB — HEPATITIS B SURFACE ANTIGEN: Hepatitis B Surface Ag: NONREACTIVE

## 2024-08-12 MED ORDER — ACETAMINOPHEN 500 MG PO TABS
1000.0000 mg | ORAL_TABLET | Freq: Once | ORAL | Status: AC
  Administered 2024-08-12: 1000 mg via ORAL
  Filled 2024-08-12: qty 2
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
## 2024-08-13 LAB — GC/CHLAMYDIA PROBE AMP (~~LOC~~) NOT AT ARMC
Chlamydia: NEGATIVE
Comment: NEGATIVE
Comment: NORMAL
Neisseria Gonorrhea: NEGATIVE

## 2024-08-13 LAB — RUBELLA SCREEN: Rubella: 4.29 {index} (ref >0.99)

## 2024-08-13 LAB — CULTURE, OB URINE

## 2024-09-05 ENCOUNTER — Ambulatory Visit (INDEPENDENT_AMBULATORY_CARE_PROVIDER_SITE_OTHER): Admitting: Family Medicine

## 2024-09-05 ENCOUNTER — Other Ambulatory Visit: Payer: Self-pay

## 2024-09-05 ENCOUNTER — Encounter: Payer: Self-pay | Admitting: Family Medicine

## 2024-09-05 VITALS — BP 128/88 | HR 103 | Wt 336.6 lb

## 2024-09-05 DIAGNOSIS — Z98891 History of uterine scar from previous surgery: Secondary | ICD-10-CM

## 2024-09-05 DIAGNOSIS — F1090 Alcohol use, unspecified, uncomplicated: Secondary | ICD-10-CM

## 2024-09-05 DIAGNOSIS — Z8632 Personal history of gestational diabetes: Secondary | ICD-10-CM | POA: Insufficient documentation

## 2024-09-05 DIAGNOSIS — Z3A20 20 weeks gestation of pregnancy: Secondary | ICD-10-CM

## 2024-09-05 DIAGNOSIS — O0990 Supervision of high risk pregnancy, unspecified, unspecified trimester: Secondary | ICD-10-CM

## 2024-09-05 DIAGNOSIS — O0992 Supervision of high risk pregnancy, unspecified, second trimester: Secondary | ICD-10-CM

## 2024-09-05 DIAGNOSIS — R030 Elevated blood-pressure reading, without diagnosis of hypertension: Secondary | ICD-10-CM

## 2024-09-05 DIAGNOSIS — Z8619 Personal history of other infectious and parasitic diseases: Secondary | ICD-10-CM | POA: Diagnosis not present

## 2024-09-05 NOTE — Progress Notes (Signed)
 "    Subjective:   Joanna Buchanan is a 28 y.o. G4P3003 at [redacted]w[redacted]d by LMP being seen today for her first obstetrical visit.  Her obstetrical history is significant for obesity and gestational diabetes and previous C-section x 3. Patient does intend to breast feed. Pregnancy history fully reviewed.  Patient reports no bleeding, no contractions, no cramping, and no leaking.  HISTORY: OB History  Gravida Para Term Preterm AB Living  4 3 3  0 0 3  SAB IAB Ectopic Multiple Live Births  0 0 0 0 3    # Outcome Date GA Lbr Len/2nd Weight Sex Type Anes PTL Lv  4 Current           3 Term 10/01/23 [redacted]w[redacted]d  7 lb 0.5 oz (3.19 kg) F CS-LTranv Spinal  LIV     Name: Joanna Buchanan     Apgar1: 8  Apgar5: 9  2 Term 05/19/22 [redacted]w[redacted]d  6 lb 9.5 oz (2.99 kg) F CS-Vac Spinal  LIV     Birth Comments: Early term female with intermittent grunting; improved after PPV/CPAP.GDM     Name: Joanna Buchanan     Apgar1: 7  Apgar5: 8  1 Term 07/08/18 [redacted]w[redacted]d  6 lb (2.722 kg) F CS-Unspec EPI  LIV     Birth Comments: for HSV outbreak   Last pap smear was 04/26/2023 and was normal Past Medical History:  Diagnosis Date   [redacted] weeks gestation of pregnancy 08/17/2023   Abdominal pain complicating pregnancy 08/17/2023   Asthma    Disorder of eye 03/27/2024   Encounter for medication review 05/25/2022   Epistaxis 08/17/2023   Follow-up examination following treatment with high-risk medication 08/19/2023   GERD (gastroesophageal reflux disease)    Gestational diabetes    Gestational diabetes 04/13/2023   [facility] w/ Diabetes educator. NOT TESTING CBGs. Ordered Dexcom. Pharmacy mixup. Called 12/17 to correct.   Current Diabetic Medications:  None   [x]  Aspirin  81 mg daily after 12 weeks (= A2/B GDM)     Required Referrals for A1GDM or A2GDM:  [ ]  Diabetes Education and Testing Supplies-[facility]NK[facility]  [ ]  Nutrition Cousult     Baseline and surveillance labs (pulled in from [facility], refresh links as needed)            Gestational  diabetes mellitus (GDM) affecting pregnancy, antepartum 03/30/2022   Current Diabetic Medications:  None  [ ]  Aspirin  81 mg daily after 12 weeks (? A2/B GDM)  Required Referrals for A1GDM or A2GDM: [ ]  Diabetes Education and Testing Supplies [ ]  Nutrition Cousult  For A2/B GDM or higher classes of DM [ ]  Diabetes Education and Testing Supplies [ ]  Nutrition Counsult [ ]  Fetal ECHO after 20 weeks  [ ]  Eye exam for retina evaluation   Baseline and surveillance labs (   Herpes, genital    LGA (large for gestational age) fetus affecting management of mother 09/23/2023   Medical history non-contributory    Non-compliance 09/22/2023   Obesity, unspecified 05/31/2022   Postpartum state 05/28/2022   Prophylactic organ removal 05/15/2024   S/P repeat low transverse C-section 10/01/2023   Sheltered homelessness 08/28/2023   08/23/23: Living in car     SOB (shortness of breath) 08/17/2023   Supervision of high risk pregnancy, antepartum 04/13/2023              NURSING     PROVIDER      Office Location    [facility]    Dating  by    LMP c/w U/S at 8 wks      [facility] Model    Centering    Anatomy U/S    WNl, but limited      Initiated care at     13wks                           Language     English                     LAB RESULTS       Support Person    FOB    Genetics    NIPS: LR female  AFP: Negative                NT/IT (FT only)           Past Surgical History:  Procedure Laterality Date   CESAREAN SECTION  07/08/2018   CESAREAN SECTION N/A 05/19/2022   Procedure: CESAREAN SECTION;  Surgeon: Eldonna Suzen Octave, MD;  Location: [facility] LD [facility]S;  Service: Obstetrics;  Laterality: N/A;   CESAREAN SECTION N/A 10/01/2023   Procedure: CESAREAN SECTION;  Surgeon: Nicholaus Burnard HERO, MD;  Location: [facility] LD ORS;  Service: Obstetrics;  Laterality: N/A;   TONSILLECTOMY     Family History  Problem Relation Age of Onset   Diabetes Mother    Hypertension Mother    Asthma Mother    Multiple sclerosis Mother     Schizophrenia Father    Cancer Maternal Aunt    Diabetes Maternal Grandmother    Birth defects Neg Hx    Heart disease Neg Hx    Stroke Neg Hx    Social History[1] Allergies[2] Medications Ordered Prior to Encounter[3]   Exam   Vitals:   09/05/24 1450  BP: 128/88  Pulse: (!) 103  Weight: (!) 336 lb 9.6 oz (152.7 kg)   Fetal Heart Rate (bpm): 97  System: General: well-developed, well-nourished female in no acute distress   Skin: normal coloration and turgor, no rashes   Neurologic: oriented, normal, negative, normal mood   Extremities: normal strength, tone, and muscle mass, ROM of all joints is normal   HEENT PERRLA, extraocular movement intact and sclera clear, anicteric   Mouth/Teeth mucous membranes moist, pharynx normal without lesions and dental hygiene good   Neck supple and no masses   Cardiovascular: regular rate and rhythm   Respiratory:  no respiratory distress, normal breath sounds   Abdomen: soft, non-tender; bowel sounds normal; no masses,  no organomegaly     Assessment:   Pregnancy: [id number] Patient Active Problem List   Diagnosis Date Noted   History of gestational diabetes mellitus (GDM) 09/05/2024   Supervision of high risk pregnancy, antepartum 06/28/2024   GAD (generalized anxiety disorder) 12/20/2023   Alcohol use disorder, severe, dependence (HCC) 12/20/2023   Prediabetes 04/28/2023   Encounter for counseling 04/26/2023   Drug use complicating pregnancy 04/26/2023   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 Supervision of high risk pregnancy, antepartum (Primary) FHR appropriate today - CBC/D/Plt+RPR+Rh+ABO+RubIgG... - Hemoglobin A1c - AFP, Serum, Open Spina Bifida - PANORAMA PRENATAL TEST - Comp Met (CMET) - Protein / creatinine ratio,  urine - TSH  2. Elevated blood pressure reading without  diagnosis of hypertension Blood pressure within normal limits at today's visit  3. History of C-section Prior C-section x 3 Will need repeat C-section.  Considering tubal ligation if she is having a boy.  Will further discuss once results come back.  4. Alcohol use disorder In the past but not at this time  5. History of herpes genitalis Will need Valtrex  prophylaxis starting at 34-36 weeks  6. History of gestational diabetes mellitus (GDM) Needs early testing, collecting hemoglobin A1c today.   Initial labs drawn. Continue prenatal vitamins. Genetic Screening discussed, NIPS: ordered. Ultrasound discussed; fetal anatomic survey: ordered. Problem list reviewed and updated. The nature of [facility] with multiple MDs and other Advanced Practice Providers was explained to patient; also emphasized that residents, students are part of our team. Routine obstetric precautions reviewed. No follow-ups on file.        [1]  Social History Tobacco Use   Smoking status: Every Day    Types: Cigars    Passive exposure: Current   Smokeless tobacco: Never   Tobacco comments:    Pt states she does not smoke tobacco, she uses the outer paper of cigars to smoke marijuana  Vaping Use   Vaping status: Never Used  Substance Use Topics   Alcohol use: Not Currently   Drug use: Yes    Types: Marijuana    Comment: daily use  [2]  Allergies Allergen Reactions   Other Anaphylaxis, Shortness Of Breath and Swelling    Avocado, watermelon, cherries, bananas, oranges cause throat swelling   Iodine Itching   Shellfish Allergy Itching  [3]  Current Outpatient Medications on File Prior to Visit  Medication Sig Dispense Refill   albuterol  (VENTOLIN  HFA) 108 (90 Base) MCG/ACT inhaler Inhale 2 puffs into the lungs every 4 (four) hours as needed for wheezing or shortness of breath. 1 each 0   Prenatal  Vit-Fe Fumarate-FA (PRENATAL MULTIVITAMIN) TABS tablet Take 1 tablet by mouth daily at 12 noon. 100 tablet 1   No current facility-administered medications on file prior to visit.   "

## 2024-09-10 LAB — AFP, SERUM, OPEN SPINA BIFIDA
AFP MoM: 0.63
AFP Value: 24.6 ng/mL
Gest. Age on Collection Date: 20 wk
Maternal Age At EDD: 26.9 a
OSBR Risk 1 IN: 10000
Test Results:: NEGATIVE
Weight: 336 [lb_av]

## 2024-09-10 LAB — COMPREHENSIVE METABOLIC PANEL WITH GFR
ALT: 12 IU/L (ref 0–32)
AST: 10 IU/L (ref 0–40)
Albumin: 3.9 g/dL — ABNORMAL LOW (ref 4.0–5.0)
Alkaline Phosphatase: 59 IU/L (ref 41–116)
BUN/Creatinine Ratio: 17 (ref 9–23)
BUN: 11 mg/dL (ref 6–20)
Bilirubin Total: <0.2 mg/dL (ref 0.0–1.2)
CO2: 17 mmol/L — ABNORMAL LOW (ref 20–29)
Calcium: 9.3 mg/dL (ref 8.7–10.2)
Chloride: 106 mmol/L (ref 96–106)
Creatinine, Ser: 0.64 mg/dL (ref 0.57–1.00)
Globulin, Total: 2.9 g/dL (ref 1.5–4.5)
Glucose: 110 mg/dL — ABNORMAL HIGH (ref 70–99)
Potassium: 4.1 mmol/L (ref 3.5–5.2)
Sodium: 135 mmol/L (ref 134–144)
Total Protein: 6.8 g/dL (ref 6.0–8.5)
eGFR: 125 mL/min/1.73

## 2024-09-10 LAB — CBC/D/PLT+RPR+RH+ABO+RUBIGG...
Antibody Screen: NEGATIVE
Basophils Absolute: 0 x10E3/uL (ref 0.0–0.2)
Basos: 0 %
EOS (ABSOLUTE): 0.2 x10E3/uL (ref 0.0–0.4)
Eos: 2 %
HCV Ab: NONREACTIVE
HIV Screen 4th Generation wRfx: NONREACTIVE
Hematocrit: 35.9 % (ref 34.0–46.6)
Hemoglobin: 11.9 g/dL (ref 11.1–15.9)
Hepatitis B Surface Ag: NEGATIVE
Immature Grans (Abs): 0 x10E3/uL (ref 0.0–0.1)
Immature Granulocytes: 0 %
Lymphocytes Absolute: 2.4 x10E3/uL (ref 0.7–3.1)
Lymphs: 29 %
MCH: 29.5 pg (ref 26.6–33.0)
MCHC: 33.1 g/dL (ref 31.5–35.7)
MCV: 89 fL (ref 79–97)
Monocytes Absolute: 0.6 x10E3/uL (ref 0.1–0.9)
Monocytes: 7 %
Neutrophils Absolute: 4.9 x10E3/uL (ref 1.4–7.0)
Neutrophils: 61 %
Platelets: 304 x10E3/uL (ref 150–450)
RBC: 4.04 x10E6/uL (ref 3.77–5.28)
RDW: 13.2 % (ref 11.7–15.4)
RPR Ser Ql: NONREACTIVE
Rh Factor: POSITIVE
Rubella Antibodies, IGG: 5.63 {index}
WBC: 8.1 x10E3/uL (ref 3.4–10.8)

## 2024-09-10 LAB — HEMOGLOBIN A1C
Est. average glucose Bld gHb Est-mCnc: 126 mg/dL
Hgb A1c MFr Bld: 6 % — ABNORMAL HIGH (ref 4.8–5.6)

## 2024-09-10 LAB — TSH: TSH: 1.35 u[IU]/mL (ref 0.450–4.500)

## 2024-09-10 LAB — HCV INTERPRETATION

## 2024-09-12 ENCOUNTER — Ambulatory Visit: Payer: Self-pay | Admitting: Family Medicine

## 2024-09-12 ENCOUNTER — Encounter: Payer: Self-pay | Admitting: *Deleted

## 2024-09-12 DIAGNOSIS — R7303 Prediabetes: Secondary | ICD-10-CM

## 2024-09-12 DIAGNOSIS — O0990 Supervision of high risk pregnancy, unspecified, unspecified trimester: Secondary | ICD-10-CM

## 2024-09-12 LAB — PANORAMA PRENATAL TEST FULL PANEL:PANORAMA TEST PLUS 5 ADDITIONAL MICRODELETIONS: FETAL FRACTION: 2.9

## 2024-09-14 NOTE — Telephone Encounter (Addendum)
 Informed pt of elevated A1C, she was agreeable to scheduled lab visit for early 2hr Gtt 09/17/24 at 09:20.  We discussed need for fasting 8 hours prior to starting test.    Waddell, RN   ----- Message from Nurse Cyndee PARAS, RN sent at 09/13/2024  4:30 PM EST ----- Attempted to call patient to discuss results and get her scheduled. Was unable to reach. Left voicemail for patient to call the office back. Will attempt to contact again.   Chase, RN  ----- Message ----- From: Ilean Norleen GAILS, MD Sent: 09/12/2024   3:20 PM EST To: [facility] Clinical Pool  Hey, this patient needs an glucose tolerance test.  Could we get her scheduled for this?  Thanks

## 2024-09-17 ENCOUNTER — Other Ambulatory Visit: Payer: Self-pay

## 2024-10-04 ENCOUNTER — Encounter: Payer: Self-pay | Admitting: Obstetrics and Gynecology

## 2024-11-01 ENCOUNTER — Encounter: Payer: Self-pay | Admitting: Obstetrics and Gynecology

## 2024-11-01 ENCOUNTER — Other Ambulatory Visit: Payer: Self-pay
